# Patient Record
Sex: Male | Born: 1975
Health system: Southern US, Community
[De-identification: ages and names within clinical notes are randomized; demographics above are authoritative.]

## PROBLEM LIST (undated history)

## (undated) DIAGNOSIS — E079 Disorder of thyroid, unspecified: Secondary | ICD-10-CM

## (undated) DIAGNOSIS — I10 Essential (primary) hypertension: Secondary | ICD-10-CM

## (undated) DIAGNOSIS — F419 Anxiety disorder, unspecified: Secondary | ICD-10-CM

## (undated) DIAGNOSIS — L42 Pityriasis rosea: Secondary | ICD-10-CM

## (undated) DIAGNOSIS — E785 Hyperlipidemia, unspecified: Secondary | ICD-10-CM

## (undated) DIAGNOSIS — K219 Gastro-esophageal reflux disease without esophagitis: Secondary | ICD-10-CM

## (undated) HISTORY — DX: Pityriasis rosea: L42

## (undated) HISTORY — DX: Hyperlipidemia, unspecified: E78.5

## (undated) HISTORY — PX: UPPER GASTROINTESTINAL ENDOSCOPY: SHX188

## (undated) HISTORY — DX: Essential (primary) hypertension: I10

## (undated) HISTORY — DX: Anxiety disorder, unspecified: F41.9

## (undated) HISTORY — DX: Disorder of thyroid, unspecified: E07.9

---

## 1994-11-14 HISTORY — PX: UMBILICAL HERNIA REPAIR: SHX196

## 1996-11-14 HISTORY — PX: SHOULDER SURGERY: SHX246

## 2008-10-28 ENCOUNTER — Ambulatory Visit: Payer: Self-pay | Admitting: Family Medicine

## 2008-10-29 DIAGNOSIS — E781 Pure hyperglyceridemia: Secondary | ICD-10-CM | POA: Insufficient documentation

## 2008-10-29 LAB — CONVERTED CEMR LAB
ALT: 14 units/L (ref 0–53)
Alkaline Phosphatase: 62 units/L (ref 39–117)
CO2: 27 meq/L (ref 19–32)
LDL Cholesterol: 102 mg/dL — ABNORMAL HIGH (ref 0–99)
Sodium: 141 meq/L (ref 135–145)
Total Bilirubin: 0.7 mg/dL (ref 0.3–1.2)
Total Protein: 7.3 g/dL (ref 6.0–8.3)
VLDL: 45 mg/dL — ABNORMAL HIGH (ref 0–40)

## 2008-12-18 ENCOUNTER — Telehealth: Payer: Self-pay | Admitting: Family Medicine

## 2009-05-15 ENCOUNTER — Ambulatory Visit: Payer: Self-pay | Admitting: Family Medicine

## 2009-09-21 ENCOUNTER — Ambulatory Visit: Payer: Self-pay | Admitting: Occupational Medicine

## 2010-06-11 ENCOUNTER — Ambulatory Visit: Payer: Self-pay | Admitting: Family Medicine

## 2010-06-28 ENCOUNTER — Encounter: Payer: Self-pay | Admitting: Family Medicine

## 2010-06-29 ENCOUNTER — Encounter: Payer: Self-pay | Admitting: Family Medicine

## 2010-06-29 LAB — CONVERTED CEMR LAB
Albumin: 4.9 g/dL (ref 3.5–5.2)
CO2: 24 meq/L (ref 19–32)
Cholesterol: 234 mg/dL — ABNORMAL HIGH (ref 0–200)
Glucose, Bld: 86 mg/dL (ref 70–99)
LDL Cholesterol: 127 mg/dL — ABNORMAL HIGH (ref 0–99)
Potassium: 4.4 meq/L (ref 3.5–5.3)
RBC: 5.23 M/uL (ref 4.22–5.81)
Sodium: 138 meq/L (ref 135–145)
Total Protein: 7.2 g/dL (ref 6.0–8.3)
Triglycerides: 305 mg/dL — ABNORMAL HIGH (ref <150)
WBC: 9 10*3/uL (ref 4.0–10.5)

## 2010-07-02 ENCOUNTER — Ambulatory Visit: Payer: Self-pay | Admitting: Family Medicine

## 2010-07-02 DIAGNOSIS — K219 Gastro-esophageal reflux disease without esophagitis: Secondary | ICD-10-CM | POA: Insufficient documentation

## 2010-12-14 NOTE — Assessment & Plan Note (Signed)
Summary: 2 week f/u BP, TG, GERD   Vital Signs:  Patient profile:   35 year old male Height:      71 inches Weight:      178 pounds Pulse rate:   81 / minute BP sitting:   136 / 84  (left arm) Cuff size:   regular  Vitals Entered By: Avon Gully CMA, Tadashi Burkel Dull) (July 02, 2010 4:07 PM)  Serial Vital Signs/Assessments:  Time      Position  BP       Pulse  Resp  Temp     By 4:09 PM             136/84   78                    Andrea McCrimmon CMA, (AAMA)  CC: f/u Bp   Primary Care Provider:  Nani Gasser MD  CC:  f/u Bp.  History of Present Illness: 127/80, 112/73  126/79 are home BPs. These are normal. Brought in home BP machine to compare to ours   Dexilant samples I gave him made a big improvement in is reflux sxs.  Picked up a new rx. No S.E.  Chest pain has resolved from the last visit.   Current Medications (verified): 1)  Fish Oil 1000 Mg Caps (Omega-3 Fatty Acids) .... Take One Capsule By Mouth Twice A Day 2)  Dexilant 60 Mg Cpdr (Dexlansoprazole) .... Take One Tablet By Mouth 2o Minuites Before Breakfast  Allergies (verified): 1)  Lisinopril (Lisinopril)  Physical Exam  General:  Well-developed,well-nourished,in no acute distress; alert,appropriate and cooperative throughout examination Lungs:  Normal respiratory effort, chest expands symmetrically. Lungs are clear to auscultation, no crackles or wheezes. Heart:  Normal rate and regular rhythm. S1 and S2 normal without gallop, murmur, click, rub or other extra sounds.   Impression & Recommendations:  Problem # 1:  HYPERTENSION, BENIGN ESSENTIAL (ICD-401.1) Home BP cuff is accurate to our machine adn home BPs look great.  Will remove dx of HTN and will monitor yearly.    Problem # 2:  HYPERTRIGLYCERIDEMIA (ICD-272.1) Started taking fish oildaily. Due to recheck in 8 weeks.   Problem # 3:  GERD (ICD-530.81) Doing well on dexlant. Recommend take for 2-3 months and then try to change back to the  prilosec.  His updated medication list for this problem includes:    Dexilant 60 Mg Cpdr (Dexlansoprazole) .Marland Kitchen... Take one tablet by mouth 2o minuites before breakfast  Complete Medication List: 1)  Fish Oil 1000 Mg Caps (Omega-3 fatty acids) .... Take two capsule by mouth twice a day 2)  Dexilant 60 Mg Cpdr (Dexlansoprazole) .... Take one tablet by mouth 2o minuites before breakfast  Patient Instructions: 1)  Recheck Triglycerides in 6-8 weeks, fasting. Can call for the lab slip.  2)

## 2010-12-14 NOTE — Miscellaneous (Signed)
  Clinical Lists Changes  Medications: Added new medication of DEXILANT 60 MG CPDR (DEXLANSOPRAZOLE) take one tablet by mouth 2o minuites before breakfast - Signed Removed medication of PRILOSEC OTC 20 MG TBEC (OMEPRAZOLE MAGNESIUM) One tablet once daily Rx of DEXILANT 60 MG CPDR (DEXLANSOPRAZOLE) take one tablet by mouth 2o minuites before breakfast;  #30 x 2;  Signed;  Entered by: Avon Gully CMA, (AAMA);  Authorized by: Nani Gasser MD;  Method used: Electronically to [facility] [address] [phone]*, [address] [place], [place], [place]  [address], Ph: [phone] or [phone], Fax: [phone]    Prescriptions: DEXILANT 60 MG CPDR (DEXLANSOPRAZOLE) take one tablet by mouth 2o minuites before breakfast  #30 x 2   Entered by:   Avon Gully CMA, (AAMA)   Authorized by:   Nani Gasser MD   Signed by:   Avon Gully CMA, (AAMA) on 06/29/2010   Method used:   [facility] to        [phone] (retail)       [address] [place], [place]  [address]       Ph: [phone] or [phone]       Fax: [phone]   RxID:   [id number]

## 2010-12-14 NOTE — Assessment & Plan Note (Signed)
Summary: Atypcial CP, HTN   Vital Signs:  Patient profile:   35 year old male Height:      71 inches Weight:      159 pounds BMI:     22.26 Pulse rate:   96 / minute BP sitting:   159 / 94  (left arm) Cuff size:   regular  Vitals Entered By: Avon Gully CMA, Duncan Dull) (June 11, 2010 3:56 PM) CC: Bp check   Primary Care Provider:  Nani Gasser MD  CC:  Bp check.  History of Present Illness: CP woke him up around 1-2 AM in the morning about 7 days ago. Lasted an hour Pain over the mid sternum.  No dizziness, No pain in the arm.  No sweating or nausea.  Hard to go back to sleep. Will occ get  a mild discomfort in same about abnout 2 x a  amonth. Occ burping and brash. Hx of acid reflux in the past.  Says sometimes wiht  movement will notices the pain. No alleviating sxs.  Hasnt had any new meds. He is off the BP medications because of cough. Felt the Benicar caused cough as well so topped it. Likely the ACEi cough had not rsolved yet.  He had checked his BP at home and says usually gets great numbers.    Current Medications (verified): 1)  Prilosec Otc 20 Mg Tbec (Omeprazole Magnesium) .... One Tablet Once Daily 2)  Fish Oil 1000 Mg Caps (Omega-3 Fatty Acids) .... Take One Capsule By Mouth Twice A Day  Allergies (verified): 1)  Lisinopril (Lisinopril)  Comments:  Nurse/Medical Assistant: The patient's medications and allergies were reviewed with the patient and were updated in the Medication and Allergy Lists. Avon Gully CMA, Duncan Dull) (June 11, 2010 3:58 PM)  Past History:  Family History: Last updated: 10/28/2008 Mother and Father with HTN  Physical Exam  General:  Well-developed,well-nourished,in no acute distress; alert,appropriate and cooperative throughout examination Head:  Normocephalic and atraumatic without obvious abnormalities. No apparent alopecia or balding. Neck:  No deformities, masses, or tenderness noted. Chest Wall:  No deformities, masses,  tenderness or gynecomastia noted. Lungs:  Normal respiratory effort, chest expands symmetrically. Lungs are clear to auscultation, no crackles or wheezes. Heart:  Normal rate and regular rhythm. S1 and S2 normal without gallop, murmur, click, rub or other extra sounds. No murmur lying flat.  No carotid bruits.  Abdomen:  Bowel sounds positive,abdomen soft and non-tender without masses, organomegaly or hernias noted. Extremities:  No LE edema.  Neurologic:  alert & oriented X3.   Skin:  no rashes.   Cervical Nodes:  No lymphadenopathy noted Psych:  Cognition and judgment appear intact. Alert and cooperative with normal attention span and concentration. No apparent delusions, illusions, hallucinations   Impression & Recommendations:  Problem # 1:  CHEST PAIN, ATYPICAL (ICD-786.59) Explained less likely to be cardiac. More likely to be GERD related or possible costchondritis since pain can occ be positional.  IN meantime will rule out anemia, thyroid d/o et and check lipids.  EKG shows rate of 68 bpm, NSR, no acute changes.  Consider trial PPI for 2 weeks to see if notices a difference.  + hx of GERD. Used to see GI.   Orders: EKG w/ Interpretation (93000) T-Comprehensive Metabolic Panel (16[address]-60454) T-CBC No Diff (09811-91478) T-TSH (29562-13086) T-Lipid Profile (57846-96295)  Problem # 2:  HYPERTENSION, BENIGN ESSENTIAL (ICD-401.1) BP is elevated today.  Check BP daily at home for one week in the AM and evening  and bring in pressures and machine to compare it to our machine to eval if has true HTN or white coat hypertension.   Complete Medication List: 1)  Prilosec Otc 20 Mg Tbec (Omeprazole magnesium) .... One tablet once daily 2)  Fish Oil 1000 Mg Caps (Omega-3 fatty acids) .... Take one capsule by mouth twice a day  Patient Instructions: 1)  BP is elevated today.  Check BP daily at home for one week in the AM and evening and bring in pressures and machine to compare it to our  machine to eval if has true HTN or white coat hypertension.  2)  We will call you with your lab results.  3)  Please schedule a follow-up appointment in 1 weeks.  4)  Start dexilant one a day in the morning.

## 2011-03-11 ENCOUNTER — Inpatient Hospital Stay (INDEPENDENT_AMBULATORY_CARE_PROVIDER_SITE_OTHER)
Admission: RE | Admit: 2011-03-11 | Discharge: 2011-03-11 | Disposition: A | Discharge status: Home / Self Care | Payer: Commercial Indemnity | Source: Ambulatory Visit | Attending: Family Medicine | Admitting: Family Medicine

## 2011-03-11 ENCOUNTER — Encounter: Payer: Self-pay | Admitting: Family Medicine

## 2011-03-11 DIAGNOSIS — R030 Elevated blood-pressure reading, without diagnosis of hypertension: Secondary | ICD-10-CM | POA: Insufficient documentation

## 2011-03-11 DIAGNOSIS — J0100 Acute maxillary sinusitis, unspecified: Secondary | ICD-10-CM

## 2011-10-17 NOTE — Progress Notes (Signed)
Summary: SINUS PROBLEMS/TJ rm 4   Vital Signs:  Patient Profile:   35 Years Old Male CC:      sinus pressure and congeston x 2-3 wks Height:     71 inches Weight:      173 pounds O2 Sat:      99 % O2 treatment:    Room Air Temp:     98.5 degrees F oral Pulse rate:   80 / minute Resp:     14 per minute BP sitting:   152 / 88  (left arm) Cuff size:   regular  Vitals Entered By: Clemens Catholic LPN (March 11, 2011 4:34 PM)                  Updated Prior Medication List: FISH OIL 1000 MG CAPS (OMEGA-3 FATTY ACIDS) Take two capsule by mouth twice a day DEXILANT 60 MG CPDR (DEXLANSOPRAZOLE) take one tablet by mouth 2o minuites before breakfast  Current Allergies: ! * IVP DYE LISINOPRIL (LISINOPRIL)History of Present Illness Chief Complaint: sinus pressure and congeston x 2-3 wks History of Present Illness:  Subjective:  Patient complains of onset of mild cold symptoms that started about 3 weeks ago but never really developed a cough.  He has developed a mild fronal headache and nasal drainage has been green.  Mild scratchy throat and post-nasal drainage.  No fevers, chills, and sweats  He notes that his blood pressure at home is always within normal limits (he states that he uses a wrist monitor).  REVIEW OF SYSTEMS Constitutional Symptoms      Denies fever, chills, night sweats, weight loss, weight gain, and fatigue.  Eyes       Denies change in vision, eye pain, eye discharge, glasses, contact lenses, and eye surgery. Ear/Nose/Throat/Mouth       Complains of frequent runny nose and sinus problems.      Denies hearing loss/aids, change in hearing, ear pain, ear discharge, dizziness, frequent nose bleeds, sore throat, hoarseness, and tooth pain or bleeding.  Respiratory       Complains of dry cough.      Denies productive cough, wheezing, shortness of breath, asthma, bronchitis, and emphysema/COPD.  Cardiovascular       Denies murmurs, chest pain, and tires easily with  exhertion.    Gastrointestinal       Denies stomach pain, nausea/vomiting, diarrhea, constipation, blood in bowel movements, and indigestion. Genitourniary       Denies painful urination, kidney stones, and loss of urinary control. Neurological       Denies paralysis, seizures, and fainting/blackouts. Musculoskeletal       Denies muscle pain, joint pain, joint stiffness, decreased range of motion, redness, swelling, muscle weakness, and gout.  Skin       Denies bruising, unusual mles/lumps or sores, and hair/skin or nail changes.  Psych       Denies mood changes, temper/anger issues, anxiety/stress, speech problems, depression, and sleep problems. Other Comments: pt c/o sinus pressure and congestion, HA, green nasal drainage x 2-3wks. he has taken OTC allergy med and Dayquil with no relief.   Past History:  Past Medical History: Reviewed history from 10/28/2008 and no changes required. None  Past Surgical History: Reviewed history from 10/28/2008 and no changes required. Left Shoulder Surgery 1997  Family History: Reviewed history from 10/28/2008 and no changes required. Mother and Father with HTN  Social History: Reviewed history from 10/28/2008 and no changes required. 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  BS degree.  Married to [employer] with one child.  Never Smoked Alcohol use-yes 1-2 q wk Drug use-no Regular exercise-no   Objective:  Appearance:  Patient appears healthy, stated age, and in no acute distress  Eyes:  Pupils are equal, round, and reactive to light and accomdation.  Extraocular movement is intact.  Conjunctivae are not inflamed.  Ears:  Canals normal.  Tympanic membranes normal.   Nose:  Congested turbinates.  No sinus tenderness  Pharynx:  Normal  Neck:  Supple.  No adenopathy is present.   Lungs:  Clear to auscultation.  Breath sounds are equal.  Heart:  Regular rate and rhythm without murmurs, rubs, or gallops.  Assessment New Problems: ELEVATED BP  READING WITHOUT DX HYPERTENSION (ICD-796.2) ACUTE MAXILLARY SINUSITIS (ICD-461.0)   Plan New Medications/Changes: NASONEX 50 MCG/ACT SUSP (MOMETASONE FUROATE) Two sprays in each nostril once a day  #17gm x 1, 03/11/2011, Donna Christen MD AMOXICILLIN 875 MG TABS (AMOXICILLIN) One by mouth two times a day  #20 x 0, 03/11/2011, Donna Christen MD  New Orders: Est. Patient Level IV [45409] Planning Comments:   Begin amoxicillin, expectorant, topical decongestant, Nasonex, saline nasal irrigation.  Increase fluid intake Followup with PCP if not improving 7 to 10 days Discussed elevated BP.  Advised to continue to monitor home BP.  Recommend that he calibrate wrist monitor BP with an arm cuff measurement.  Follow-up with PCP for BP.   The patient and/or caregiver has been counseled thoroughly with regard to medications prescribed including dosage, schedule, interactions, rationale for use, and possible side effects and they verbalize understanding.  Diagnoses and expected course of recovery discussed and will return if not improved as expected or if the condition worsens. Patient and/or caregiver verbalized understanding.  Prescriptions: NASONEX 50 MCG/ACT SUSP (MOMETASONE FUROATE) Two sprays in each nostril once a day  #17gm x 1   Entered and Authorized by:   Donna Christen MD   Signed by:   Donna Christen MD on 03/11/2011   Method used:   Print then Give to Patient   RxID:   [id number] AMOXICILLIN 875 MG TABS (AMOXICILLIN) One by mouth two times a day  #20 x 0   Entered and Authorized by:   Donna Christen MD   Signed by:   Donna Christen MD on 03/11/2011   Method used:   Print then Give to Patient   RxID:   [id number]   Patient Instructions: 1)  Take Mucinex  (guaifenesin) twice daily for congestion. 2)  Increase fluid intake, rest. 3)  May use Afrin nasal spray (or generic oxymetazoline) twice daily for about 5 days.  Also recommend using saline nasal spray several times daily  and/or saline nasal irrigation. 4)  Use Nasonex spray about 15 minutes after using Afrin spray 5)  Followup with family doctor if not improving 7 to 10 days.   Orders Added: 1)  Est. Patient Level IV [32440]

## 2011-12-06 DIAGNOSIS — N469 Male infertility, unspecified: Secondary | ICD-10-CM | POA: Insufficient documentation

## 2012-05-25 ENCOUNTER — Emergency Department (INDEPENDENT_AMBULATORY_CARE_PROVIDER_SITE_OTHER)
Admission: EM | Admit: 2012-05-25 | Discharge: 2012-05-25 | Disposition: A | Discharge status: Home / Self Care | Payer: Commercial Indemnity | Source: Home / Self Care | Attending: Emergency Medicine | Admitting: Emergency Medicine

## 2012-05-25 DIAGNOSIS — L247 Irritant contact dermatitis due to plants, except food: Secondary | ICD-10-CM

## 2012-05-25 DIAGNOSIS — L255 Unspecified contact dermatitis due to plants, except food: Secondary | ICD-10-CM

## 2012-05-25 HISTORY — DX: Gastro-esophageal reflux disease without esophagitis: K21.9

## 2012-05-25 MED ORDER — PREDNISONE (PAK) 10 MG PO TABS: ORAL_TABLET | ORAL | Status: AC

## 2012-05-25 MED ORDER — HYDROXYZINE HCL 25 MG PO TABS: 25.0000 mg | ORAL_TABLET | Freq: Once | ORAL | Status: AC

## 2012-05-25 MED ORDER — METHYLPREDNISOLONE ACETATE 80 MG/ML IJ SUSP
80.0000 mg | Freq: Once | INTRAMUSCULAR | Status: AC
  Administered 2012-05-25: 80 mg via INTRAMUSCULAR

## 2012-05-25 NOTE — ED Provider Notes (Signed)
History     CSN: [id number]  Arrival date & time 05/25/12  1117   First MD Initiated Contact with Patient 05/25/12 1121      Chief Complaint  Patient presents with  . Rash    2 days    HPI Exposed to poison ivy about a week ago and had diffuse pruritic rash patches that were pruritic without any drainage. He seemed to be improving somewhat but then 2 days ago, worsening similar rash on trunk and extremities and neck. Has tried OTC topical steroid cream without significant help. The itch is severe and keeps him up at night. Past Medical History  Diagnosis Date  . GERD (gastroesophageal reflux disease)     Past Surgical History  Procedure Date  . Shoulder surgery 1998    Family History  Problem Relation Age of Onset  . Hyperlipidemia Father     History  Substance Use Topics  . Smoking status: Never Smoker   . Smokeless tobacco: Never Used  . Alcohol Use: Not on file      Review of Systems  HENT: Negative for facial swelling and trouble swallowing.   All other systems reviewed and are negative.    Allergies  Lisinopril  Home Medications   Current Outpatient Rx  Name Route Sig Dispense Refill  . OMEPRAZOLE 20 MG PO CPDR Oral Take 20 mg by mouth as needed.    Marland Kitchen HYDROXYZINE HCL 25 MG PO TABS Oral Take 1 tablet (25 mg total) by mouth once. Take 1 at bedtime as needed for itch. May cause drowsiness. 10 tablet 0  . PREDNISONE (PAK) 10 MG PO TABS  Take as directed for 6 days. 21 tablet 0    BP 156/84  Pulse 82  Temp 97.9 F (36.6 C) (Oral)  Resp 17  Ht 5' 11.5" (1.816 m)  Wt 177 lb (80.287 kg)  BMI 24.34 kg/m2  SpO2 96%  Physical Exam  Nursing note and vitals reviewed. Constitutional: He is oriented to person, place, and time. He appears well-developed and well-nourished. No distress.  HENT:  Head: Normocephalic and atraumatic.  Eyes: Conjunctivae and EOM are normal. Pupils are equal, round, and reactive to light. No scleral icterus.  Neck: Normal range  of motion.  Cardiovascular: Normal rate.   Pulmonary/Chest: Effort normal.  Abdominal: He exhibits no distension.  Musculoskeletal: Normal range of motion.  Neurological: He is alert and oriented to person, place, and time.  Skin: Skin is warm. Rash (diffuse patches of blanching erythema, maculopapular , in streaks in clusters, on neck, few small areas on face, trunk and upper and lower extremities.) noted.  Psychiatric: He has a normal mood and affect.    ED Course  Procedures (including critical care time)  Labs Reviewed - No data to display No results found.   1. Contact dermatitis and eczema due to plant       MDM  Severe contact dermatitis, he likely would benefit from Depo-Medrol IM and prednisone 10 mg 6 day Dosepak. After risks, benefits, alternatives discussed, patient agrees with this treatment. I also prescribed by mouth hydroxyzine, one at bedtime when necessary severe itch. Other general measures discussed. Followup here or with PCP when necessary.        Lajean Manes, MD 05/25/12 1201

## 2012-05-25 NOTE — ED Notes (Signed)
Brad complains of a rash around his waist and buttock for 2 days. The rash itches but is not painful. Denies fever, chill, or sweats.

## 2012-09-24 ENCOUNTER — Ambulatory Visit (INDEPENDENT_AMBULATORY_CARE_PROVIDER_SITE_OTHER): Payer: Commercial Indemnity | Admitting: Family Medicine

## 2012-09-24 ENCOUNTER — Encounter: Payer: Self-pay | Admitting: Family Medicine

## 2012-09-24 VITALS — BP 150/94 | HR 89 | Ht 71.5 in | Wt 178.0 lb

## 2012-09-24 DIAGNOSIS — Z0000 Encounter for general adult medical examination without abnormal findings: Secondary | ICD-10-CM

## 2012-09-24 DIAGNOSIS — Z23 Encounter for immunization: Secondary | ICD-10-CM

## 2012-09-24 NOTE — Progress Notes (Signed)
Subjective:    Patient ID: Robert Morris, male    DOB: [birth year], 36 y.o.   MRN: [id number]  HPI He is here for complete physical exam today. He has no specific polyps or complaints. He did have a labral repair on his left shoulder and says still occasionally has problems with the shoulder. He's been considering going back to see an orthopedist about it. He says the muscles like the shoulder subluxes.   Review of Systems Comprehensive review of systems is negative. Chest pain or shortness of breath.  BP 150/94  Pulse 89  Ht 5' 11.5" (1.816 m)  Wt 178 lb (80.74 kg)  BMI 24.48 kg/m2    Allergies  Allergen Reactions  . Lisinopril     REACTION: cough    Past Medical History  Diagnosis Date  . GERD (gastroesophageal reflux disease)     Past Surgical History  Procedure Date  . Shoulder surgery 1998    Left, repair of labrum  . Umbilical hernia repair 1996    History   Social History  . Marital Status: Married    Spouse Name: N/A    Number of Children: 1  . Years of Education: N/A   Occupational History  . saleman      [employer]   Social History Main Topics  . Smoking status: Never Smoker   . Smokeless tobacco: Never Used  . Alcohol Use: 1.8 oz/week    3 Cans of beer per week  . Drug Use: No  . Sexually Active: Yes -- Male partner(s)   Other Topics Concern  . Not on file   Social History Narrative   Occ exercise.     Family History  Problem Relation Age of Onset  . Hyperlipidemia Father     Outpatient Encounter Prescriptions as of 09/24/2012  Medication Sig Dispense Refill  . omeprazole (PRILOSEC) 20 MG capsule Take 20 mg by mouth as needed.               Objective:   Physical Exam  Constitutional: He is oriented to person, place, and time. He appears well-developed and well-nourished.  HENT:  Head: Normocephalic and atraumatic.  Right Ear: External ear normal.  Left Ear: External ear normal.  Nose: Nose normal.    Mouth/Throat: Oropharynx is clear and moist.  Eyes: Conjunctivae normal and EOM are normal. Pupils are equal, round, and reactive to light.  Neck: Normal range of motion. Neck supple. No thyromegaly present.  Cardiovascular: Normal rate, regular rhythm, normal heart sounds and intact distal pulses.        No carotid bruits.   Pulmonary/Chest: Effort normal and breath sounds normal.  Abdominal: Soft. Bowel sounds are normal. He exhibits no distension and no mass. There is no tenderness. There is no rebound and no guarding.  Musculoskeletal: Normal range of motion. He exhibits no edema.  Lymphadenopathy:    He has no cervical adenopathy.  Neurological: He is alert and oriented to person, place, and time. He has normal reflexes.  Skin: Skin is warm and dry.  Psychiatric: He has a normal mood and affect. His behavior is normal. Judgment and thought content normal.          Assessment & Plan:  CPE Keep up a regular exercise program and make sure you are eating a healthy diet Try to eat 4 servings of dairy a day, or if you are lactose intolerant take a calcium with vitamin D daily.  Your vaccines are up to  date.  Go for CMp and lpids when able.   Shoulder Pain - Recommend see Dr. Karie Schwalbe if he is interested in further evaluation but not necessarily at point of wanting surgery.    Tdap and flu vaccine given today.   Elevated BP - Home BPs well controlled and brought in home cuff today to compare to ours and it looks good. Home cuff seems to be working well.   Form completed for work.

## 2012-09-24 NOTE — Patient Instructions (Addendum)
Keep up a regular exercise program and make sure you are eating a healthy diet Try to eat 4 servings of dairy a day, or if you are lactose intolerant take a calcium with vitamin D daily.  Your vaccines are up to date.   

## 2012-09-25 LAB — LIPID PANEL
Cholesterol: 211 mg/dL — ABNORMAL HIGH (ref 0–200)
VLDL: 75 mg/dL — ABNORMAL HIGH (ref 0–40)

## 2012-09-26 ENCOUNTER — Telehealth: Payer: Self-pay | Admitting: *Deleted

## 2012-09-26 LAB — COMPLETE METABOLIC PANEL WITH GFR
AST: 16 U/L (ref 0–37)
Albumin: 4.9 g/dL (ref 3.5–5.2)
BUN: 14 mg/dL (ref 6–23)
Calcium: 9.7 mg/dL (ref 8.4–10.5)
Chloride: 105 mEq/L (ref 96–112)
Glucose, Bld: 87 mg/dL (ref 70–99)
Potassium: 4.6 mEq/L (ref 3.5–5.3)
Total Protein: 6.9 g/dL (ref 6.0–8.3)

## 2012-09-27 NOTE — Telephone Encounter (Signed)
See other message

## 2013-10-21 ENCOUNTER — Encounter: Payer: Self-pay | Admitting: Emergency Medicine

## 2013-10-21 ENCOUNTER — Emergency Department (INDEPENDENT_AMBULATORY_CARE_PROVIDER_SITE_OTHER)
Admission: EM | Admit: 2013-10-21 | Discharge: 2013-10-21 | Disposition: A | Discharge status: Home / Self Care | Payer: Commercial Indemnity | Source: Home / Self Care | Attending: Family Medicine | Admitting: Family Medicine

## 2013-10-21 DIAGNOSIS — K219 Gastro-esophageal reflux disease without esophagitis: Secondary | ICD-10-CM

## 2013-10-21 MED ORDER — OMEPRAZOLE 40 MG PO CPDR: DELAYED_RELEASE_CAPSULE | ORAL | Status: DC

## 2013-10-21 MED ORDER — GI COCKTAIL ~~LOC~~: 30.0000 mL | Freq: Once | ORAL | Status: DC

## 2013-10-21 NOTE — ED Notes (Signed)
Epigastric pain worse today, patient states he has had this pain for years, takes nexium for reflux

## 2013-10-21 NOTE — ED Provider Notes (Signed)
CSN: [id number]     Arrival date & time 10/21/13  1834 History   First MD Initiated Contact with Patient 10/21/13 1932     Chief Complaint  Patient presents with  . Abdominal Pain      HPI Comments: Patient has been having intermittent epigastric pain for several weeks.  Today at 4pm while driving he had similar pain radiating to left chest that lasted about 10 minutes.  Later today while lifting a crate of drinks he felt a brief episode of epigastric discomfort.  No nausea/vomiting.  No shortness of breath. He has a long history of GERD.  Approximately 20 years ago he had upper endoscopy that revealed gastritis, treated with omeprazole.  He has taken omeprazole intermittently since then.  Over the past month he has taken OTC Nexium 20mg  once daily. Patient has a history of hypertriglyceridemia.  Family history of hypertension but no ASCVD.  Patient is a 37 y.o. male presenting with chest pain. The history is provided by the patient.  Chest Pain Pain location:  Epigastric and L chest Pain quality: burning   Pain radiates to:  Precordial region Pain radiates to the back: no   Pain severity:  Mild Onset quality:  Sudden Duration:  10 minutes Timing:  Intermittent Progression:  Improving Chronicity:  Recurrent Context: lifting and at rest   Relieved by:  None tried Worsened by:  Certain positions Ineffective treatments: OTC Nexium. Associated symptoms: AICD problem and heartburn   Associated symptoms: no abdominal pain, no anorexia, no anxiety, no back pain, no cough, no diaphoresis, no dizziness, no dysphagia, no fatigue, no fever, no lower extremity edema, no nausea, no near-syncope, no palpitations, no shortness of breath and not vomiting   Risk factors: hypertension and male sex     Past Medical History  Diagnosis Date  . GERD (gastroesophageal reflux disease)    Past Surgical History  Procedure Laterality Date  . Shoulder surgery  1998    Left, repair of labrum  . Umbilical  hernia repair  1996   Family History  Problem Relation Age of Onset  . Hyperlipidemia Father    History  Substance Use Topics  . Smoking status: Never Smoker   . Smokeless tobacco: Never Used  . Alcohol Use: 1.8 oz/week    3 Cans of beer per week    Review of Systems  Constitutional: Negative for fever, diaphoresis and fatigue.  HENT: Negative for trouble swallowing.   Respiratory: Negative for cough and shortness of breath.   Cardiovascular: Positive for chest pain. Negative for palpitations and near-syncope.  Gastrointestinal: Positive for heartburn. Negative for nausea, vomiting, abdominal pain and anorexia.  Musculoskeletal: Negative for back pain.  Neurological: Negative for dizziness.  All other systems reviewed and are negative.    Allergies  Lisinopril  Home Medications   Current Outpatient Rx  Name  Route  Sig  Dispense  Refill  . omeprazole (PRILOSEC) 40 MG capsule      Take one capsule by mouth twice daily, 30 minutes before a meal.   60 capsule   1    BP 156/96  Pulse 80  Temp(Src) 98.3 F (36.8 C) (Oral)  Ht 5\' 11"  (1.803 m)  Wt 181 lb (82.101 kg)  BMI 25.26 kg/m2  SpO2 98% Physical Exam Nursing notes and Vital Signs reviewed. Appearance:  Patient appears healthy, stated age, and in no acute distress Eyes:  Pupils are equal, round, and reactive to light and accomodation.  Extraocular movement is intact.  Conjunctivae are not inflamed   Pharynx:  Normal Neck:  Supple.   No adenopathy.  Carotids have normal upstrokes Lungs:  Clear to auscultation.  Breath sounds are equal.  Chest:  No tenderness to palpation Heart:  Regular rate and rhythm without murmurs, rubs, or gallops.  Abdomen:  Vague sub-xiphoid tenderness without masses or hepatosplenomegaly.  Bowel sounds are present.  No CVA or flank tenderness.  Extremities:  No edema.  No calf tenderness Skin:  No rash present.   ED Course  Procedures  none        EKG Interpretation     Date/Time: 10/21/13; 1902    Ventricular Rate:  74   PR Interval:  0.0162    QRS Duration:  0.102   QT Interval:  0.346    QTC Calculation:  0.370   R Axis:   41 degrees    Text Interpretation:  Normal; no change from previous              MDM   1. GERD (gastroesophageal reflux disease), recurrent    Chart reviewed; EKG has no significant change from 06/11/10  Patient's epigastric discomfort resolved after GI cocktail. Begin omeprazole 40mg  BID.  Refer to [facility] Recommend follow-up with PCP for consideration of exercise stress test. If symptoms become significantly worse during the night or over the weekend, proceed to the local emergency room.     Lattie Haw, MD 10/21/13 2031

## 2013-10-22 ENCOUNTER — Telehealth: Payer: Self-pay | Admitting: *Deleted

## 2013-10-25 ENCOUNTER — Ambulatory Visit (INDEPENDENT_AMBULATORY_CARE_PROVIDER_SITE_OTHER): Payer: Commercial Indemnity | Admitting: Family Medicine

## 2013-10-25 ENCOUNTER — Encounter: Payer: Self-pay | Admitting: Family Medicine

## 2013-10-25 VITALS — BP 137/78 | HR 87 | Temp 97.2°F | Ht 71.0 in | Wt 178.0 lb

## 2013-10-25 DIAGNOSIS — R1013 Epigastric pain: Secondary | ICD-10-CM

## 2013-10-25 DIAGNOSIS — R0789 Other chest pain: Secondary | ICD-10-CM

## 2013-10-25 DIAGNOSIS — R232 Flushing: Secondary | ICD-10-CM

## 2013-10-25 LAB — COMPLETE METABOLIC PANEL WITH GFR
AST: 17 U/L (ref 0–37)
Alkaline Phosphatase: 49 U/L (ref 39–117)
BUN: 14 mg/dL (ref 6–23)
Calcium: 9.6 mg/dL (ref 8.4–10.5)
Chloride: 100 mEq/L (ref 96–112)
Creat: 1.17 mg/dL (ref 0.50–1.35)
Glucose, Bld: 75 mg/dL (ref 70–99)
Total Bilirubin: 1 mg/dL (ref 0.3–1.2)
Total Protein: 7.4 g/dL (ref 6.0–8.3)

## 2013-10-25 LAB — CBC WITH DIFFERENTIAL/PLATELET
Basophils Absolute: 0.1 10*3/uL (ref 0.0–0.1)
Eosinophils Absolute: 0.2 10*3/uL (ref 0.0–0.7)
Eosinophils Relative: 3 % (ref 0–5)
Hemoglobin: 16.4 g/dL (ref 13.0–17.0)
Lymphs Abs: 1.7 10*3/uL (ref 0.7–4.0)
MCH: 30.4 pg (ref 26.0–34.0)
MCHC: 35.9 g/dL (ref 30.0–36.0)
MCV: 84.6 fL (ref 78.0–100.0)
Monocytes Relative: 8 % (ref 3–12)
Neutrophils Relative %: 65 % (ref 43–77)
RBC: 5.4 MIL/uL (ref 4.22–5.81)
RDW: 14 % (ref 11.5–15.5)

## 2013-10-25 LAB — TSH: TSH: 3.522 u[IU]/mL (ref 0.350–4.500)

## 2013-10-25 LAB — LIPID PANEL
Cholesterol: 219 mg/dL — ABNORMAL HIGH (ref 0–200)
HDL: 45 mg/dL (ref >39)
Triglycerides: 284 mg/dL — ABNORMAL HIGH (ref <150)
VLDL: 57 mg/dL — ABNORMAL HIGH (ref 0–40)

## 2013-10-25 NOTE — Progress Notes (Signed)
Subjective:    Patient ID: Robert Morris, male    DOB: 30-Dec-1975, 37 y.o.   MRN: [id number]  HPI Had been having epigastric pain on and off for weeks. Went to UC about 4 days ago and was having some chest discomfort. They did an EKG and it was normal. No "heartburn".  Says about a week ago expoerience some numbness and tingling in his left arm when in the shower. Only once episode. Says the epigastric pain felt like a cramping and feels worse when bends forwards.  Occ aggrevated by eating but not consistantly.  Pain will  Last about 15 minutes.  No nausea.  Says his face is more flushed.  Did start prilosec BID 2 days ago after getting relief from GI cocktail. + fam hx of hear disease on mom's side of family. Still has a GB.  Has been feeling more flushed lately.  No NSAIDs, ASA , etc.   Has been stressed out.   Review of Systems     BP 137/78  Pulse 87  Temp(Src) 97.2 F (36.2 C)  Ht 5\' 11"  (1.803 m)  Wt 178 lb (80.74 kg)  BMI 24.84 kg/m2    Allergies  Allergen Reactions  . Lisinopril     REACTION: cough    Past Medical History  Diagnosis Date  . GERD (gastroesophageal reflux disease)     Past Surgical History  Procedure Laterality Date  . Shoulder surgery  1998    Left, repair of labrum  . Umbilical hernia repair  1996    History   Social History  . Marital Status: Married    Spouse Name: N/A    Number of Children: 1  . Years of Education: N/A   Occupational History  . saleman      [employer]   Social History Main Topics  . Smoking status: Never Smoker   . Smokeless tobacco: Never Used  . Alcohol Use: 1.8 oz/week    3 Cans of beer per week  . Drug Use: No  . Sexual Activity: Yes    Partners: Female   Other Topics Concern  . Not on file   Social History Narrative   Occ exercise.     Family History  Problem Relation Age of Onset  . Hyperlipidemia Father   . Bipolar disorder Brother     Outpatient Encounter Prescriptions as of  10/25/2013  Medication Sig  . omeprazole (PRILOSEC) 40 MG capsule Take one capsule by mouth twice daily, 30 minutes before a meal.       Objective:   Physical Exam  Constitutional: He is oriented to person, place, and time. He appears well-developed and well-nourished.  HENT:  Head: Normocephalic and atraumatic.  Right Ear: External ear normal.  Left Ear: External ear normal.  Nose: Nose normal.  Mouth/Throat: Oropharynx is clear and moist.  Eyes: Conjunctivae and EOM are normal. Pupils are equal, round, and reactive to light.  Neck: Neck supple. No thyromegaly present.  Cardiovascular: Normal rate and normal heart sounds.   Pulmonary/Chest: Effort normal and breath sounds normal.  Abdominal: Soft. Bowel sounds are normal. He exhibits no distension and no mass. There is tenderness. There is no rebound and no guarding.  Mildl tendernes at teh epigastrum  Lymphadenopathy:    He has no cervical adenopathy.  Neurological: He is alert and oriented to person, place, and time.  Skin: Skin is warm and dry.  Psychiatric: He has a normal mood and affect.  Assessment & Plan:  Epigastric pain - Still maybe GERD, ulcer, etc.  recommend continue Protonix twice a day at least for the next couple weeks to see if he improves. If no improvement at that point in time then consider referral to GI for further evaluation. I would like to do some lab work today just to make sure that his blood count is normal. We'll also check liver enzymes.  Flushing-unclear etiology. Certainly could be stress induced. His blood pressure looks fine today but he has a couple blood pressures at home or his diastolic was around 90. Certainly this could be related. He denies taking any other products such as NSAIDs which can also cause flushing. Will check TSH.    Atypical chest pain-I think cardiac is less likely that there is a family history of heart disease. If his workup is negative and he continues to have  symptoms then consider further evaluation with treadmill stress test. chech TSH.

## 2013-12-09 ENCOUNTER — Other Ambulatory Visit: Payer: Self-pay | Admitting: *Deleted

## 2013-12-09 MED ORDER — OMEPRAZOLE 40 MG PO CPDR: DELAYED_RELEASE_CAPSULE | ORAL | Status: DC

## 2014-03-25 ENCOUNTER — Telehealth: Payer: Self-pay | Admitting: Family Medicine

## 2014-03-25 NOTE — Telephone Encounter (Signed)
Please call patient and have him try to decrease his Prilosec/omeprazole to once a day. Typically we only try to keep patients on the twice a day dosing for very short period of time. If he is still taking it twice a day then he needs to decrease his dose to once a day. If this causes breakthrough symptoms then I would like him to call us back and we can consider further workup for his symptoms such as evaluating his gallbladder, et Robert Morris.

## 2014-03-26 NOTE — Telephone Encounter (Signed)
Robert Morris states he is only taking omeprazole once daily for the last 3 months. Since his endoscopy.

## 2014-03-27 NOTE — Telephone Encounter (Signed)
OK, sounds good. Thank you.  Just wanted to make sure as the insurance company had notified us.

## 2014-04-24 ENCOUNTER — Other Ambulatory Visit: Payer: Self-pay | Admitting: Family Medicine

## 2014-07-13 ENCOUNTER — Other Ambulatory Visit: Payer: Self-pay | Admitting: Family Medicine

## 2014-07-18 ENCOUNTER — Telehealth: Payer: Self-pay | Admitting: Family Medicine

## 2014-07-18 NOTE — Telephone Encounter (Signed)
Please call pt: if he is doing well on his PPI then try to decrease to once a day. We try not to use BID for long term treatment.

## 2014-07-22 NOTE — Telephone Encounter (Signed)
lvm w/recommendations.Robert Morris  

## 2014-10-07 ENCOUNTER — Ambulatory Visit (INDEPENDENT_AMBULATORY_CARE_PROVIDER_SITE_OTHER): Payer: Commercial Indemnity | Admitting: Family Medicine

## 2014-10-07 ENCOUNTER — Encounter: Payer: Self-pay | Admitting: Family Medicine

## 2014-10-07 VITALS — BP 155/85 | HR 88 | Temp 97.9°F | Ht 71.0 in | Wt 179.0 lb

## 2014-10-07 DIAGNOSIS — B09 Unspecified viral infection characterized by skin and mucous membrane lesions: Secondary | ICD-10-CM

## 2014-10-07 DIAGNOSIS — M791 Myalgia: Secondary | ICD-10-CM

## 2014-10-07 DIAGNOSIS — R21 Rash and other nonspecific skin eruption: Secondary | ICD-10-CM

## 2014-10-07 DIAGNOSIS — IMO0001 Reserved for inherently not codable concepts without codable children: Secondary | ICD-10-CM

## 2014-10-07 DIAGNOSIS — M609 Myositis, unspecified: Secondary | ICD-10-CM

## 2014-10-07 NOTE — Progress Notes (Signed)
   Subjective:    Patient ID: Robert Morris, male    DOB: [birth year], 38 y.o.   MRN: [id number]  HPI  He was in [place] last week. While this a he had some body aches and possible fever. He just felt bad that day. Then 2 days later on Saturday he noticed itchy rash. By the following night on Sunday he went to urgent care. He was given prednisone and Zyrtec. They felt like his rash was an allergic reaction. He's not sure what he may or may not have come in contact with.  Review of Systems     Objective:   Physical Exam  Constitutional: He is oriented to person, place, and time. He appears well-developed and well-nourished.  HENT:  Head: Normocephalic and atraumatic.  Right Ear: External ear normal.  Left Ear: External ear normal.  Nose: Nose normal.  Mouth/Throat: Oropharynx is clear and moist.  TMs and canals are clear.   Eyes: Conjunctivae and EOM are normal. Pupils are equal, round, and reactive to light.  Neck: Neck supple. No thyromegaly present.  Cardiovascular: Normal rate and normal heart sounds.   Pulmonary/Chest: Effort normal and breath sounds normal.  Lymphadenopathy:    He has no cervical adenopathy.  Neurological: He is alert and oriented to person, place, and time.  Skin: Skin is warm and dry.  Patchy erythematous rash on the upper chest. Appears to be a fine sandpaper type rash.  Psychiatric: He has a normal mood and affect.          Assessment & Plan:  Rash/fever.  I really like this is more viral.  Based on his history and the pattern of occurrence I really do not think that this was an allergic reaction. I feel he has some type of viral exanthem they cause some swelling in the throat as well as the myalgias and fever. Right now he is feeling mostly better. If he suddenly gets worse, the rash spreads, or he develops another fever then please call the office back immediately. Certainly if this occurs again then we'll consider referral for allergy  testing.

## 2014-10-17 ENCOUNTER — Ambulatory Visit (INDEPENDENT_AMBULATORY_CARE_PROVIDER_SITE_OTHER): Payer: Commercial Indemnity | Admitting: Physician Assistant

## 2014-10-17 ENCOUNTER — Encounter: Payer: Self-pay | Admitting: Physician Assistant

## 2014-10-17 VITALS — BP 138/79 | HR 80 | Ht 71.0 in | Wt 182.0 lb

## 2014-10-17 DIAGNOSIS — R21 Rash and other nonspecific skin eruption: Secondary | ICD-10-CM

## 2014-10-17 MED ORDER — TRIAMCINOLONE ACETONIDE 0.1 % EX CREA: 1.0000 "application " | TOPICAL_CREAM | Freq: Two times a day (BID) | CUTANEOUS | Status: DC

## 2014-10-18 LAB — CBC WITH DIFFERENTIAL/PLATELET
BASOS ABS: 0.2 10*3/uL — AB (ref 0.0–0.1)
BASOS PCT: 1 % (ref 0–1)
EOS ABS: 0.3 10*3/uL (ref 0.0–0.7)
Eosinophils Relative: 2 % (ref 0–5)
HEMATOCRIT: 45.3 % (ref 39.0–52.0)
Hemoglobin: 15.7 g/dL (ref 13.0–17.0)
Lymphocytes Relative: 24 % (ref 12–46)
Lymphs Abs: 3.6 10*3/uL (ref 0.7–4.0)
MCH: 29.5 pg (ref 26.0–34.0)
MCHC: 34.7 g/dL (ref 30.0–36.0)
MCV: 85 fL (ref 78.0–100.0)
MONOS PCT: 6 % (ref 3–12)
MPV: 9 fL — ABNORMAL LOW (ref 9.4–12.4)
Monocytes Absolute: 0.9 10*3/uL (ref 0.1–1.0)
NEUTROS PCT: 67 % (ref 43–77)
Neutro Abs: 10.1 10*3/uL — ABNORMAL HIGH (ref 1.7–7.7)
PLATELETS: 229 10*3/uL (ref 150–400)
RBC: 5.33 MIL/uL (ref 4.22–5.81)
RDW: 14.1 % (ref 11.5–15.5)
WBC: 15.1 10*3/uL — AB (ref 4.0–10.5)

## 2014-10-19 NOTE — Progress Notes (Signed)
   Subjective:    Patient ID: Robert Morris, male    DOB: Apr 29, 1976, 38 y.o.   MRN: [id number]  HPI Pt returns to clinic with same rash. [place] a [place] ago. Felt like coming down with something and developed rash. Went to UC and dx with contact/allergic dermatitis. Given prednisone and zyrtec. Followed up with [facility] 3 days later. She suspected viral exanthem. The rash is itchy and persistant. Not spread. Still contained to upper chest into neck. Taken last prednisone today. No fever, chills, body aches. Feels better and back to nromal except for rash.    Review of Systems  All other systems reviewed and are negative.      Objective:   Physical Exam  Constitutional: He appears well-developed and well-nourished.  Neck: Normal range of motion. Neck supple.  Cardiovascular: Normal rate, regular rhythm and normal heart sounds.   Pulmonary/Chest: Effort normal and breath sounds normal.  Skin:  Patchy erythematous rash of upper chest. No lesions.           Assessment & Plan:  Rash- due to prednisone not helping does not seem to be contact or allergic dermaitis. Still suspect viral etiology;however, pt is feeling much better. Will get cbc/cmp/cmv/ebv to look for viral causes. Triamcinolone given for itchiness. Will follow up accordingly. Call with any worsenen symptoms.

## 2014-10-20 LAB — EPSTEIN-BARR VIRUS VCA ANTIBODY PANEL
EBV EA IgG: 26.5 U/mL — ABNORMAL HIGH (ref <9.0)
EBV NA IgG: >600 U/mL — ABNORMAL HIGH (ref <18.0)
EBV VCA IGG: 134 U/mL — AB (ref <18.0)

## 2014-10-20 LAB — CYTOMEGALOVIRUS ANTIBODY, IGG

## 2014-10-27 ENCOUNTER — Other Ambulatory Visit: Payer: Self-pay | Admitting: *Deleted

## 2014-10-27 DIAGNOSIS — D72829 Elevated white blood cell count, unspecified: Secondary | ICD-10-CM

## 2014-10-30 ENCOUNTER — Telehealth: Payer: Self-pay | Admitting: *Deleted

## 2014-10-30 DIAGNOSIS — D72829 Elevated white blood cell count, unspecified: Secondary | ICD-10-CM

## 2014-10-30 NOTE — Telephone Encounter (Signed)
CBC ordered to recheck WBC.

## 2014-10-31 LAB — CBC WITH DIFFERENTIAL/PLATELET
Basophils Absolute: 0.1 10*3/uL (ref 0.0–0.1)
Basophils Relative: 1 % (ref 0–1)
EOS ABS: 0.4 10*3/uL (ref 0.0–0.7)
Eosinophils Relative: 4 % (ref 0–5)
HEMATOCRIT: 43.5 % (ref 39.0–52.0)
HEMOGLOBIN: 15 g/dL (ref 13.0–17.0)
LYMPHS ABS: 2.2 10*3/uL (ref 0.7–4.0)
Lymphocytes Relative: 25 % (ref 12–46)
MCH: 29.2 pg (ref 26.0–34.0)
MCHC: 34.5 g/dL (ref 30.0–36.0)
MCV: 84.8 fL (ref 78.0–100.0)
MONO ABS: 0.6 10*3/uL (ref 0.1–1.0)
MONOS PCT: 7 % (ref 3–12)
MPV: 9.4 fL (ref 9.4–12.4)
NEUTROS PCT: 63 % (ref 43–77)
Neutro Abs: 5.5 10*3/uL (ref 1.7–7.7)
Platelets: 210 10*3/uL (ref 150–400)
RBC: 5.13 MIL/uL (ref 4.22–5.81)
RDW: 13.7 % (ref 11.5–15.5)
WBC: 8.8 10*3/uL (ref 4.0–10.5)

## 2015-04-02 ENCOUNTER — Ambulatory Visit: Payer: Commercial Indemnity | Admitting: Family Medicine

## 2015-05-19 ENCOUNTER — Emergency Department (INDEPENDENT_AMBULATORY_CARE_PROVIDER_SITE_OTHER)
Admission: EM | Admit: 2015-05-19 | Discharge: 2015-05-19 | Disposition: A | Discharge status: Home / Self Care | Payer: Commercial Indemnity | Source: Home / Self Care | Attending: Emergency Medicine | Admitting: Emergency Medicine

## 2015-05-19 ENCOUNTER — Encounter: Payer: Self-pay | Admitting: *Deleted

## 2015-05-19 ENCOUNTER — Emergency Department (INDEPENDENT_AMBULATORY_CARE_PROVIDER_SITE_OTHER): Payer: Commercial Indemnity

## 2015-05-19 DIAGNOSIS — R05 Cough: Secondary | ICD-10-CM

## 2015-05-19 DIAGNOSIS — R079 Chest pain, unspecified: Secondary | ICD-10-CM | POA: Diagnosis not present

## 2015-05-19 DIAGNOSIS — R059 Cough, unspecified: Secondary | ICD-10-CM

## 2015-05-19 DIAGNOSIS — R0602 Shortness of breath: Secondary | ICD-10-CM

## 2015-05-19 NOTE — Discharge Instructions (Signed)

## 2015-05-19 NOTE — ED Notes (Signed)
Pt c/o central CP described as dull and heavy and constant x 4-5 days. Awoke with a HA today. C/o "airway constriction and dry cough x 2-3 months. Does have H/o allergies and reflux. EKG done and reported to [facility] [place]. BP 172/106, 156/96

## 2015-05-19 NOTE — ED Provider Notes (Signed)
CSN: [id number]     Arrival date & time 05/19/15  0840 History   First MD Initiated Contact with Patient 05/19/15 [phone]     Chief Complaint  Patient presents with  . Chest Pain   (Consider location/radiation/quality/duration/timing/severity/associated sxs/prior Treatment) Patient is a 39 y.o. male presenting with chest pain. The history is provided by the patient. No language interpreter was used.  Chest Pain Pain location:  L chest Pain quality: aching   Pain radiates to:  Does not radiate Pain radiates to the back: no   Pain severity:  Mild Onset quality:  Gradual Duration:  2 weeks Timing:  Constant Progression:  Unchanged Chronicity:  New Context: breathing   Relieved by:  Nothing Worsened by:  Nothing tried Ineffective treatments:  None tried Associated symptoms: heartburn   Associated symptoms: no abdominal pain   Risk factors: hypertension     Past Medical History  Diagnosis Date  . GERD (gastroesophageal reflux disease)   . Seasonal allergies    Past Surgical History  Procedure Laterality Date  . Shoulder surgery  1998    Left, repair of labrum  . Umbilical hernia repair  1996   Family History  Problem Relation Age of Onset  . Hyperlipidemia Father   . Bipolar disorder Brother    History  Substance Use Topics  . Smoking status: Never Smoker   . Smokeless tobacco: Never Used  . Alcohol Use: 1.8 oz/week    3 Cans of beer per week    Review of Systems  Cardiovascular: Positive for chest pain.  Gastrointestinal: Positive for heartburn. Negative for abdominal pain.  All other systems reviewed and are negative.   Allergies  Lisinopril  Home Medications   Prior to Admission medications   Medication Sig Start Date End Date Taking? Authorizing Provider  cetirizine (ZYRTEC) 10 MG tablet Take 10 mg by mouth. 10/05/14 10/05/15  Historical Provider, MD  omeprazole (PRILOSEC) 40 MG capsule TAKE ONE CAPSULE BY MOUTH TWICE DAILY. TAKE 30 MINUTES BEFORE A MEAL.  07/14/14   Hali Marry, MD   BP 172/106 mmHg  Pulse 75  Resp 16  SpO2 99% Physical Exam  Constitutional: He is oriented to person, place, and time. He appears well-developed and well-nourished.  HENT:  Head: Normocephalic.  Right Ear: External ear normal.  Left Ear: External ear normal.  Mouth/Throat: Oropharynx is clear and moist.  Eyes: Conjunctivae and EOM are normal. Pupils are equal, round, and reactive to light.  Neck: Normal range of motion.  Cardiovascular: Normal rate and normal heart sounds.   Pulmonary/Chest: Effort normal and breath sounds normal.  Abdominal: Soft. He exhibits no distension.  Musculoskeletal: Normal range of motion.  Neurological: He is alert and oriented to person, place, and time.  Skin: Skin is warm.  Psychiatric: He has a normal mood and affect.  Nursing note and vitals reviewed.   ED Course  Procedures (including critical care time) Labs Review Labs Reviewed - No data to display  Imaging Review Dg Chest 2 View  05/19/2015   CLINICAL DATA:  Chest pain and shortness of breath for 1 week  EXAM: CHEST  2 VIEW  COMPARISON:  None.  FINDINGS: Lungs are clear. The heart size and pulmonary vascularity are normal. No adenopathy. No pneumothorax. No bone lesions.  IMPRESSION: No edema or consolidation.   Electronically Signed   By: Lowella Grip III M.D.   On: 05/19/2015 09:25     MDM I discussed with Dr. Madilyn Fireman who will set pt  up for a stress test.   Pt advised mylanta 4 times a day..  Take an aspirin daily.  Go to ED if symptoms worsen or change.  I doubt acute cardiac etiology.   1. Cough    Asa Follow up for stress test    Fransico Meadow, PA-C 05/19/15 Leon, PA-C 05/19/15 [phone]

## 2015-05-21 ENCOUNTER — Telehealth: Payer: Self-pay | Admitting: *Deleted

## 2015-05-21 DIAGNOSIS — R0789 Other chest pain: Secondary | ICD-10-CM

## 2015-05-21 NOTE — Telephone Encounter (Signed)
Order for ETT placed for [facility] on [address].Robert Morris [place]

## 2015-05-22 ENCOUNTER — Telehealth: Payer: Self-pay | Admitting: *Deleted

## 2015-05-22 NOTE — Telephone Encounter (Signed)
lvm informing pt of appt and to rtn call to confirm that he did receive my message.Robert Morris [place]

## 2015-05-22 NOTE — Telephone Encounter (Signed)
Robert Morris called to ask if it would be ok if she lvm informing pt of appt. She has scheduled him for 8.12.2016 @ 1015. I told her that would be ok and that I would reach out to him also and if I did not get any response that I would call her and make her aware. [name], [place]

## 2015-06-21 ENCOUNTER — Other Ambulatory Visit: Payer: Self-pay | Admitting: Family Medicine

## 2015-06-26 ENCOUNTER — Encounter: Payer: Commercial Indemnity | Admitting: Physician Assistant

## 2015-06-26 ENCOUNTER — Ambulatory Visit (INDEPENDENT_AMBULATORY_CARE_PROVIDER_SITE_OTHER): Payer: Commercial Indemnity

## 2015-06-26 DIAGNOSIS — R0789 Other chest pain: Secondary | ICD-10-CM

## 2015-06-26 LAB — EXERCISE TOLERANCE TEST
CHL CUP MPHR: 180 {beats}/min
CHL CUP RESTING HR STRESS: 75 {beats}/min
CSEPED: 12 min
CSEPEW: 13.4 METS
Peak HR: 176 {beats}/min
Percent HR: 97 %
RPE: 15

## 2015-08-13 ENCOUNTER — Telehealth: Payer: Self-pay | Admitting: *Deleted

## 2015-08-13 NOTE — Telephone Encounter (Signed)
Pt called and lvm asking if dr. Madilyn Fireman would switch his acid reflux med to nexium. He currently is on omeprazole and feels that this is no longer effective. Will fwd to pcp for advice.Audelia Hives [place]

## 2015-08-14 MED ORDER — ESOMEPRAZOLE MAGNESIUM 20 MG PO CPDR: 20.0000 mg | DELAYED_RELEASE_CAPSULE | Freq: Every day | ORAL | Status: DC

## 2015-08-14 NOTE — Telephone Encounter (Signed)
New perception sent to pharmacy. But he also really needs to be watching his diet and avoiding greasy, spicy, acidic foods. In avoiding carbonated beverages and things with caffeine like coffee etc.

## 2015-08-14 NOTE — Telephone Encounter (Signed)
Patient advised.

## 2015-08-15 ENCOUNTER — Other Ambulatory Visit: Payer: Self-pay | Admitting: Family Medicine

## 2015-10-16 ENCOUNTER — Other Ambulatory Visit: Payer: Self-pay | Admitting: Family Medicine

## 2015-10-16 ENCOUNTER — Ambulatory Visit (INDEPENDENT_AMBULATORY_CARE_PROVIDER_SITE_OTHER): Payer: BLUE CROSS/BLUE SHIELD | Admitting: Family Medicine

## 2015-10-16 ENCOUNTER — Encounter: Payer: Self-pay | Admitting: Family Medicine

## 2015-10-16 VITALS — BP 136/84 | HR 87 | Temp 98.4°F | Resp 18 | Wt 188.6 lb

## 2015-10-16 DIAGNOSIS — R5383 Other fatigue: Secondary | ICD-10-CM

## 2015-10-16 DIAGNOSIS — Z91018 Allergy to other foods: Secondary | ICD-10-CM | POA: Diagnosis not present

## 2015-10-16 DIAGNOSIS — R05 Cough: Secondary | ICD-10-CM

## 2015-10-16 DIAGNOSIS — R053 Chronic cough: Secondary | ICD-10-CM

## 2015-10-16 DIAGNOSIS — Z91011 Allergy to milk products: Secondary | ICD-10-CM

## 2015-10-16 DIAGNOSIS — H0013 Chalazion right eye, unspecified eyelid: Secondary | ICD-10-CM | POA: Diagnosis not present

## 2015-10-16 LAB — COMPLETE METABOLIC PANEL WITH GFR
ALT: 25 U/L (ref 9–46)
AST: 21 U/L (ref 10–40)
Albumin: 5 g/dL (ref 3.6–5.1)
Alkaline Phosphatase: 59 U/L (ref 40–115)
BUN: 17 mg/dL (ref 7–25)
CHLORIDE: 104 mmol/L (ref 98–110)
CO2: 27 mmol/L (ref 20–31)
Calcium: 9.5 mg/dL (ref 8.6–10.3)
Creat: 1.19 mg/dL (ref 0.60–1.35)
GFR, Est African American: 87 mL/min (ref >=60)
GFR, Est Non African American: 75 mL/min (ref >=60)
GLUCOSE: 84 mg/dL (ref 65–99)
POTASSIUM: 4.3 mmol/L (ref 3.5–5.3)
SODIUM: 138 mmol/L (ref 135–146)
Total Bilirubin: 0.8 mg/dL (ref 0.2–1.2)
Total Protein: 7.2 g/dL (ref 6.1–8.1)

## 2015-10-16 LAB — CBC WITH DIFFERENTIAL/PLATELET
BASOS PCT: 1 % (ref 0–1)
Basophils Absolute: 0.1 10*3/uL (ref 0.0–0.1)
EOS PCT: 3 % (ref 0–5)
Eosinophils Absolute: 0.2 10*3/uL (ref 0.0–0.7)
HCT: 46.1 % (ref 39.0–52.0)
HEMOGLOBIN: 16.2 g/dL (ref 13.0–17.0)
Lymphocytes Relative: 21 % (ref 12–46)
Lymphs Abs: 1.7 10*3/uL (ref 0.7–4.0)
MCH: 29.8 pg (ref 26.0–34.0)
MCHC: 35.1 g/dL (ref 30.0–36.0)
MCV: 84.7 fL (ref 78.0–100.0)
MONO ABS: 0.6 10*3/uL (ref 0.1–1.0)
MPV: 9.9 fL (ref 8.6–12.4)
Monocytes Relative: 7 % (ref 3–12)
NEUTROS ABS: 5.4 10*3/uL (ref 1.7–7.7)
Neutrophils Relative %: 68 % (ref 43–77)
Platelets: 216 10*3/uL (ref 150–400)
RBC: 5.44 MIL/uL (ref 4.22–5.81)
RDW: 14 % (ref 11.5–15.5)
WBC: 7.9 10*3/uL (ref 4.0–10.5)

## 2015-10-16 LAB — C-REACTIVE PROTEIN: CRP: <0.5 mg/dL (ref <0.60)

## 2015-10-16 LAB — TSH: TSH: 7.592 u[IU]/mL — ABNORMAL HIGH (ref 0.350–4.500)

## 2015-10-16 MED ORDER — ALBUTEROL SULFATE HFA 108 (90 BASE) MCG/ACT IN AERS: 2.0000 | INHALATION_SPRAY | Freq: Four times a day (QID) | RESPIRATORY_TRACT | Status: DC | PRN

## 2015-10-16 MED ORDER — AZELASTINE HCL 0.1 % NA SOLN: 1.0000 | Freq: Two times a day (BID) | NASAL | Status: DC

## 2015-10-16 MED ORDER — BACITRACIN-POLYMYXIN B 500-10000 UNIT/GM OP OINT: 1.0000 "application " | TOPICAL_OINTMENT | Freq: Three times a day (TID) | OPHTHALMIC | Status: DC

## 2015-10-16 NOTE — Patient Instructions (Signed)
Don't use any inhalers for at least 2 days before you breathing test.

## 2015-10-16 NOTE — Progress Notes (Signed)
   Subjective:    Patient ID: Robert Morris, male    DOB: [birth year], 39 y.o.   MRN: [id number]  HPI Has had a cough since July. Says he doesn't feel "sick" like a cold.  Says feels like has a hard time breathing in sometimes. Feelsl ike his airway is swollen sometimes. He has a hx of GERD.  Taking Prilosec daily.  Had a negative stress test on 06/26/15.  Occ has epigastric pain - sometimes with eating and sometimes not. Rarely has brash. Cough is worse with exertion. Rides bike for exercising.  Sometime when eating feels like food "goes down the wrong way". Getting some phlegm in the AM but more dry thought the daytime.  Cough settle down at night. No hx of asthma.  Mother with hx of asthma.  Feels like easily gets out of breath.  Had normal CXR in [place].  He feels moe fatigued. He also reports a lot of thick phlegm in his throat which is constant. He says it's mostly clear. No discoloration. No fevers or chills.  He also reports a new bump on his right upper eyelid. He said one on the lower lid for several months. He says it feels sore and irritated. He's had these before. He last saw his eye doctor back in March.  Review of Systems     Objective:   Physical Exam  Constitutional: He is oriented to person, place, and time. He appears well-developed and well-nourished.  HENT:  Head: Normocephalic and atraumatic.  Right Ear: External ear normal.  Left Ear: External ear normal.  Nose: Nose normal.  Mouth/Throat: Oropharynx is clear and moist.  TMs and canals are clear. He does have a chalazion on the right upper and right lower eyelid. No drainage.  Eyes: Conjunctivae and EOM are normal. Pupils are equal, round, and reactive to light.  Neck: Neck supple. No thyromegaly present.  Cardiovascular: Normal rate and normal heart sounds.   Pulmonary/Chest: Effort normal and breath sounds normal.  Lymphadenopathy:    He has no cervical adenopathy.  Neurological: He is alert and oriented to person,  place, and time.  Skin: Skin is warm and dry.  Psychiatric: He has a normal mood and affect.          Assessment & Plan:  Chronic cough-most likely to be reflux since he is on a PPI and takes it regularly and has actual reflux symptoms have improved. Unlikely to be cardiac had a negative stress test. Consider postnasal drip and a trial of a nasal and histamine. Also consider late onset asthma. Consider trial of albuterol before exercise since it does seem to aggravate her symptoms. Will check for pertussis.  Consider trial of oral and histamine. We'll also do serum allergy testing.  Fatigue-we'll check for anemia, thyroid disorder, electrolyte disturbance etc.  Chalazia - given a prescription for topical antibiotic and encouraged him to get back in with his eye doctor. He says he does not need a referral for this.

## 2015-10-17 LAB — SEDIMENTATION RATE: Sed Rate: 1 mm/hr (ref 0–15)

## 2015-10-19 ENCOUNTER — Other Ambulatory Visit: Payer: Self-pay | Admitting: Family Medicine

## 2015-10-19 LAB — RESPIRATORY ALLERGY PROFILE REGION II ~~LOC~~
ALLERGEN, CEDAR TREE, T6: 0.75 kU/L — AB
Allergen, Comm Silver Birch, t9: <0.1 kU/L
Allergen, Cottonwood, t14: <0.1 kU/L
Allergen, D pternoyssinus,d7: <0.1 kU/L
Allergen, Mulberry, t76: <0.1 kU/L
Aspergillus fumigatus, m3: <0.1 kU/L
Bermuda Grass: 2.08 kU/L — ABNORMAL HIGH
Cladosporium Herbarum: <0.1 kU/L
Cockroach: <0.1 kU/L
Common Ragweed: <0.1 kU/L
Dog Dander: <0.1 kU/L
Elm IgE: <0.1 kU/L
IgE (Immunoglobulin E), Serum: 16 kU/L (ref <115)
JOHNSON GRASS: 1.2 kU/L — AB
OAK CLASS: 0.17 kU/L — AB
Pecan/Hickory Tree IgE: 0.22 kU/L — ABNORMAL HIGH
Penicillium Notatum: <0.1 kU/L
Rough Pigweed  IgE: <0.1 kU/L
Timothy Grass: 2.58 kU/L — ABNORMAL HIGH

## 2015-10-19 LAB — T3, FREE: T3, Free: 3.1 pg/mL (ref 2.3–4.2)

## 2015-10-19 LAB — T4, FREE: Free T4: 1.1 ng/dL (ref 0.80–1.80)

## 2015-10-21 LAB — IGG FOOD PANEL
ALLERGEN EGG YOLK IGG: 4.1 ug/mL — AB (ref <2.0)
ALLERGEN WHEAT IGG: 0.53 ug/mL — AB (ref <0.15)
ALLERGEN, MILK, IGG: 16.6 ug/mL — AB (ref <0.15)
Beef, IgG: 7.5 ug/mL — ABNORMAL HIGH (ref <2.0)
Chicken, IgG: <0.15 ug/mL (ref <0.15)
Corn, IgG: <0.15 ug/mL (ref <0.15)
Egg white, IgG: 4.4 ug/mL — ABNORMAL HIGH (ref <2.0)
Peanut, IgG: <0.15 ug/mL (ref <0.15)

## 2015-10-21 LAB — BORDETELLA PERTUSSIS PCR
B PERTUSSIS, DNA: NOT DETECTED
B parapertussis, DNA: NOT DETECTED

## 2015-10-21 NOTE — Addendum Note (Signed)
Addended by: Beatrice Lecher D on: 10/21/2015 02:34 PM   Modules accepted: Orders

## 2015-10-23 ENCOUNTER — Telehealth: Payer: Self-pay

## 2015-10-23 NOTE — Telephone Encounter (Signed)
Left message informing patient of lab results and recommendations. 

## 2015-10-23 NOTE — Telephone Encounter (Signed)
-----   Message from Hali Marry, MD sent at 10/20/2015  7:57 AM EST ----- Call patient: Additional thyroid tests looked normal but because his TSH was mildly elevated I would like to recheck it in about 4 weeks with a TSH, free T4 and free T3. Sometimes the levels can just become abnormal for a short period of time especially during an illness. But I want to make sure that he's not trending towards becoming hypothyroid.

## 2015-10-27 LAB — BORDETELLA PERTUSSIS AB IGG,IGA
FHA IGA: 4 [IU]/mL
FHA IgG: 47 IU/mL
PT IGG: 27 [IU]/mL
PT IgA: 2 IU/mL

## 2015-11-02 LAB — CULTURE, BORDETELLA PERTUSSIS

## 2015-11-13 ENCOUNTER — Encounter: Payer: Self-pay | Admitting: Family Medicine

## 2015-11-13 ENCOUNTER — Ambulatory Visit (INDEPENDENT_AMBULATORY_CARE_PROVIDER_SITE_OTHER): Payer: BLUE CROSS/BLUE SHIELD | Admitting: Family Medicine

## 2015-11-13 VITALS — BP 159/101 | HR 74 | Ht 71.0 in | Wt 191.0 lb

## 2015-11-13 DIAGNOSIS — R221 Localized swelling, mass and lump, neck: Secondary | ICD-10-CM | POA: Diagnosis not present

## 2015-11-13 DIAGNOSIS — R0602 Shortness of breath: Secondary | ICD-10-CM

## 2015-11-13 DIAGNOSIS — R053 Chronic cough: Secondary | ICD-10-CM

## 2015-11-13 DIAGNOSIS — R05 Cough: Secondary | ICD-10-CM | POA: Diagnosis not present

## 2015-11-13 MED ORDER — METRONIDAZOLE 0.75 % EX GEL: 1.0000 "application " | Freq: Every day | CUTANEOUS | Status: DC

## 2015-11-13 MED ORDER — LOSARTAN POTASSIUM 50 MG PO TABS: 50.0000 mg | ORAL_TABLET | Freq: Every day | ORAL | Status: DC

## 2015-11-13 MED ORDER — ALBUTEROL SULFATE (2.5 MG/3ML) 0.083% IN NEBU
2.5000 mg | INHALATION_SOLUTION | Freq: Once | RESPIRATORY_TRACT | Status: AC
  Administered 2015-11-13: 2.5 mg via RESPIRATORY_TRACT

## 2015-11-13 NOTE — Progress Notes (Signed)
Subjective:    Patient ID: Robert Morris, male    DOB: Aug 17, 1976, 39 y.o.   MRN: [id number]  HPI Mr. Robert Morris is a 39 year old male who is here today for spirometry testing. I saw him about 4 weeks ago for chronic cough, intermittent shortness of breath, and fatigue. He has had a chronic cough since July and says he sometimes feels like his airways are swollen. We also tested him for food allergy. He showed possible sensitivity to beef, eggs, milk and possibly wheat. He was also sensitive to several grasses as well as cedar trees and pecan trees. He had artery tried and failed a PPI to reduce his cough symptoms. It was felt to be unlikely cardiac related. He had a negative stress test on 06/26/2015.   We did do some lab work which showed a borderline thyroid test. He tested negative for pertussis. We did place a referral for allergy consult. No history of pulmonary problems or asthma. Other the last few days he's felt like a lump in his throat particularly on the left side. It's not painful but is somewhat uncomfortable.  He also has concerns about rosacea. He has multiple family members that have rosacea and is very fair skin. He saysconstantly looks red and sometimes more flushed. He occasionally gets little acne bumps on his forehead and occasionally on his cheeks. All the uses a gel that works really well to control his symptoms and he is interested in treatment.  Review of Systems     Objective:   Physical Exam  Constitutional: He is oriented to person, place, and time. He appears well-developed and well-nourished.  HENT:  Head: Normocephalic and atraumatic.  Eyes: Conjunctivae and EOM are normal.  Cardiovascular: Normal rate.   Pulmonary/Chest: Effort normal.  Neurological: He is alert and oriented to person, place, and time.  Skin: Skin is dry. No pallor.  A few scattered papules on the forehead.   Psychiatric: He has a normal mood and affect. His behavior is normal.  Vitals  reviewed.         Assessment & Plan:  SOB/Cough - at this point he does have some food allergies which we have recently discovered. I recommend avoiding these foods strictly for about 3-4 weeks just to see if it improves his symptoms. Unfortunately the allergy referral was going to be in April. We will work on getting him scheduled at the [facility] location to see if they have availability sooner. I printed off information and gave it to him as he says he will call on his arm. Also because of the sensation of feeling like something is stuck in his throat that he is experiencing the last few days I think we should go ahead and refer him to ENT to rule out any kind of upper airway issue as well. Spirometry shows FVC of 87%, FEV1 of 92%, ratio of 86%. This consistent with normal spirometry with no improvement postbronchodilator. This rules out COPD and asthma.   HTN- looking back over his blood pressure since 2009 his pressures have been up and down. He does report a family history of hypertension as well. We eventually try him on lisinopril at one point in time he developed a cough. We'll try perception for losartan. Like to see him back in about 4-6 weeks to make sure that his blood pressure is well controlled and is tolerating the medication well. He will need a BMP at that time.  Rosacea  -discussed diagnosis. I do  think he may have some mild rosacea. Will treat with topical metronidazole gel. He also has some permanent erythema from sun damage and discussed with him the importance of really being very diligent with his fair skin and minimizing sun exposure and using sunscreens. Also discussed that some of his flushing may even be related to elevated blood pressure levels at times.

## 2015-12-11 ENCOUNTER — Encounter: Payer: Self-pay | Admitting: Internal Medicine

## 2015-12-11 ENCOUNTER — Ambulatory Visit (INDEPENDENT_AMBULATORY_CARE_PROVIDER_SITE_OTHER): Payer: BLUE CROSS/BLUE SHIELD | Admitting: Internal Medicine

## 2015-12-11 VITALS — BP 124/76 | HR 68 | Temp 98.4°F | Resp 16 | Ht 71.0 in | Wt 187.6 lb

## 2015-12-11 DIAGNOSIS — J302 Other seasonal allergic rhinitis: Secondary | ICD-10-CM | POA: Diagnosis not present

## 2015-12-11 DIAGNOSIS — T7840XA Allergy, unspecified, initial encounter: Secondary | ICD-10-CM | POA: Diagnosis not present

## 2015-12-11 DIAGNOSIS — R0602 Shortness of breath: Secondary | ICD-10-CM | POA: Diagnosis not present

## 2015-12-11 MED ORDER — EPINEPHRINE 0.3 MG/0.3ML IJ SOAJ: INTRAMUSCULAR | Status: DC

## 2015-12-11 MED ORDER — AZELASTINE-FLUTICASONE 137-50 MCG/ACT NA SUSP: 1.0000 | Freq: Two times a day (BID) | NASAL | Status: DC

## 2015-12-11 NOTE — Patient Instructions (Addendum)
Other seasonal allergic rhinitis  Allergic to grass and tree pollen  Continue cetirizine 10 mg daily  Start dymista 1 spray each nostril twice a day  Allergic reaction  Check tryptase, alpha gal, CU index panel  Consider repeat EGD looking for eosinophilic esophagitis  Continue Nexium  Given epipen and action plan - educated on use  Cetirizine daily as above

## 2015-12-11 NOTE — Assessment & Plan Note (Addendum)
   Check tryptase, alpha gal, CU index panel  Consider repeat EGD looking for eosinophilic esophagitis  Continue Nexium  Given epipen and action plan - educated on use  Cetirizine daily as above

## 2015-12-11 NOTE — Progress Notes (Signed)
Referring provider: Hali Marry, [address] [place], [place] [address]  History of Present Illness:  Robert Morris is a 40 y.o. male seen in consultation at the kind request of Dr. Madilyn Fireman for allergic reactions.  HPI Comments: Allergic reaction: He had an episode of urticaria in November 2016 which prompted a visit to the emergency room. Symptoms occurred after traveling to [place]. No obvious triggers such as foods medicines or products. He was treated with prednisone with resolution of his symptoms. In April 2016, patient developed shortness of breath and throat tightness. He went to the emergency room where a chest x-ray, CT of the head, EKG were all within normal limits. A CBC and CMP were also normal with an eosinophil count of 400 which is the upper end of normal. He reports throat swelling at least once a day that is usually worse after eating. He has an associated dry cough. He often chokes on solid foods such as steak or chicken but has never had food stuck for a long period of time. He had an EGD done 2 years ago that was normal and had a negative biopsy for H. pylori. He's also had a normal HIDA scan. An abdominal ultrasound done showed hepatic steatosis with a polyp in his gallbladder. Lab work obtained recently by his primary care physician demonstrated a normal CBC with differential (eosinophil count at that time was 200), CMP, pertussis screen and culture was negative. Specific IgE to foods were not done. He was found to have an elevated TSH but normal T4. He is being monitored for that.  Shortness of breath/cough: Patient feels short of breath especially when he wakes from sleep. He has tried albuterol without improvement in his symptoms. He occasionally has exercise-induced bronchospasm. He has never had any severe exacerbations.  Allergic rhinitis: Patient gets symptoms that are worse in the spring. Sinus infections occur about once every 2 years. He has  tried Astelin without improvement in his symptoms. Zyrtec makes him groggy sometimes. Specific IgE to environmental allergens was positive for grass and tree pollen.   Assessment and Plan: Other seasonal allergic rhinitis  Allergic to grass and tree pollen  Continue cetirizine 10 mg daily. May also use Claritin or Allegra 1 tablet daily  Start dymista 1 spray each nostril twice a day  Allergic reaction  Check tryptase, alpha gal, CU index panel  Consider repeat EGD looking for eosinophilic esophagitis  Continue Nexium  Given epipen and action plan - educated on use  Cetirizine daily as above  Shortness of breath  Continue as needed albuterol    Return in about 3 months (around 03/10/2016).  Medications ordered this encounter: Meds ordered this encounter  Medications  . esomeprazole (NEXIUM) 20 MG capsule    Sig:     Refill:  2    Diagnostics: Spirometry: FEV1 4L or 93%, FEV1/FVC  83%.  This is a normal study. Food allergy skin testing: Negative with a good histamine control  Skin tests were interpreted by me, transferred into EPIC by CMA, reviewed and accepted by me into EPIC.  Physical Exam: BP 124/76 mmHg  Pulse 68  Temp(Src) 98.4 F (36.9 C) (Oral)  Resp 16  Ht 5\' 11"  (1.803 m)  Wt 187 lb 9.8 oz (85.1 kg)  BMI 26.18 kg/m2   Physical Exam  Review of systems: Per HPI unless specifically indicated below Review of Systems  Constitutional: Negative for fever, chills and unexpected weight change.  HENT: Positive  for postnasal drip, rhinorrhea and sneezing. Negative for congestion, ear pain and sinus pressure.   Eyes: Positive for itching. Negative for pain and redness.  Respiratory: Positive for cough, choking and shortness of breath. Negative for chest tightness and wheezing.   Cardiovascular: Negative for chest pain and leg swelling.  Gastrointestinal: Negative for vomiting and diarrhea.  Genitourinary: Negative for difficulty urinating.   Musculoskeletal: Negative for joint swelling and arthralgias.  Skin: Positive for rash.  Allergic/Immunologic: Positive for environmental allergies and food allergies. Negative for immunocompromised state.       Stung by yellow jacket - no problem No latex allergy  Neurological: Negative for seizures.    Past medical history:  Patient Active Problem List   Diagnosis Date Noted  . Other seasonal allergic rhinitis 12/11/2015  . Allergic reaction 12/11/2015  . Shortness of breath 12/11/2015  . ELEVATED BP READING WITHOUT DX HYPERTENSION 03/11/2011  . GERD 07/02/2010  . HYPERTRIGLYCERIDEMIA 10/29/2008    Past surgical history: Past Surgical History  Procedure Laterality Date  . Shoulder surgery  1998    Left, repair of labrum  . Umbilical hernia repair  1996    Family history: Family History  Problem Relation Age of Onset  . Hyperlipidemia Father   . Bipolar disorder Brother   . Asthma Mother     Environmental/Social history: He lives in a house that is 41 years of age, he has a non-feather pillow and comforter, there is carpet in the home, there is central air conditioning and heating, there is a dog in the home, he is a nonsmoker, he drinks 2-4 drinks per week, he is a Librarian, academic for public services.  Drug Allergies:  Allergies  Allergen Reactions  . Iodine     Had a reaction to IV dye with skin rash  . Lisinopril     REACTION: cough    Medications: Current outpatient prescriptions:  .  albuterol (PROVENTIL HFA;VENTOLIN HFA) 108 (90 BASE) MCG/ACT inhaler, Inhale 2 puffs into the lungs every 6 (six) hours as needed for wheezing or shortness of breath., Disp: 1 Inhaler, Rfl: 0 .  azelastine (ASTELIN) 0.1 % nasal spray, Place 1-2 sprays into both nostrils 2 (two) times daily. Use in each nostril as directed (Patient taking differently: Place 1-2 sprays into both nostrils as needed. Use in each nostril as directed), Disp: 30 mL, Rfl: 3 .  esomeprazole (NEXIUM) 20 MG  capsule, , Disp: , Rfl: 2 .  losartan (COZAAR) 50 MG tablet, Take 1 tablet (50 mg total) by mouth daily., Disp: 90 tablet, Rfl: 0 .  metroNIDAZOLE (METROGEL) 0.75 % gel, Apply 1 application topically daily., Disp: 45 g, Rfl: 2 .  bacitracin-polymyxin b (POLYSPORIN) ophthalmic ointment, Place 1 application into the right eye 3 (three) times daily. X 7 days (Patient not taking: Reported on 12/11/2015), Disp: 3.5 g, Rfl: 0 .  cetirizine (ZYRTEC) 10 MG tablet, Take 10 mg by mouth as needed. , Disp: , Rfl:  .  omeprazole (PRILOSEC) 40 MG capsule, TAKE ONE CAPSULE BY MOUTH TWICE DAILY. TAKE 30 MINUTES BEFORE A MEAL. (Patient not taking: Reported on 12/11/2015), Disp: 60 capsule, Rfl: 0  Thank you for the opportunity to care for this patient.  Please do not hesitate to contact me with questions.

## 2015-12-11 NOTE — Assessment & Plan Note (Signed)
   Continue as needed albuterol. ? ?

## 2015-12-11 NOTE — Assessment & Plan Note (Addendum)
   Allergic to grass and tree pollen  Continue cetirizine 10 mg daily. May also use Claritin or Allegra 1 tablet daily  Start dymista 1 spray each nostril twice a day

## 2016-02-09 ENCOUNTER — Other Ambulatory Visit: Payer: Self-pay | Admitting: Family Medicine

## 2016-03-08 ENCOUNTER — Encounter: Payer: Self-pay | Admitting: Osteopathic Medicine

## 2016-03-08 ENCOUNTER — Ambulatory Visit (INDEPENDENT_AMBULATORY_CARE_PROVIDER_SITE_OTHER): Payer: BLUE CROSS/BLUE SHIELD | Admitting: Osteopathic Medicine

## 2016-03-08 VITALS — BP 134/83 | HR 76 | Ht 71.0 in | Wt 189.0 lb

## 2016-03-08 DIAGNOSIS — Z0000 Encounter for general adult medical examination without abnormal findings: Secondary | ICD-10-CM

## 2016-03-08 DIAGNOSIS — Z13220 Encounter for screening for lipoid disorders: Secondary | ICD-10-CM | POA: Diagnosis not present

## 2016-03-08 DIAGNOSIS — R7989 Other specified abnormal findings of blood chemistry: Secondary | ICD-10-CM

## 2016-03-08 DIAGNOSIS — Z131 Encounter for screening for diabetes mellitus: Secondary | ICD-10-CM | POA: Diagnosis not present

## 2016-03-08 DIAGNOSIS — R946 Abnormal results of thyroid function studies: Secondary | ICD-10-CM | POA: Diagnosis not present

## 2016-03-08 DIAGNOSIS — I10 Essential (primary) hypertension: Secondary | ICD-10-CM

## 2016-03-08 DIAGNOSIS — Z8719 Personal history of other diseases of the digestive system: Secondary | ICD-10-CM

## 2016-03-08 NOTE — Patient Instructions (Signed)
Plan to follow-up as directed for management/monitoring of chronic medical issues. Please don't hesitate to make an appointment sooner if you're having any acute concerns or problems!  We are ordering blood work today for routine screening for problems like high cholesterol and diabetes. We are also rechecking the thyroid which was abnormal a few months ago, you should get a call back about these results sometime in the next few days, please let us know if you don't hear anything to 3 days after you've had the blood work drawn. Full discussion of results, if these are abnormal, may require an office visit with your primary care doctor.   Please let your pharmacy know when you are running low on medications/refills (do not wait until you are out of medicines). Your pharmacy will send our office a request for the appropriate medications. Please allow our office 2-3 business days to process the needed refills.   At any visits to any of your specialists, please give them our clinic information so that they can forward [place] any records, including any tests which are done or changes to your medications. This allows all your physicians to communicate effectively, putting your primary care doctor at the center of your medical care and allowing [place] to effectively coordinate your care.   Let's plan to follow-up here in the office in 12 months for annual wellness exam. If you would like to, you can get lab work done a few days before that visit so that we can go over the results in person at your appointment. You do not need an appointment to go downstairs to the lab for blood draws, they should have the orders in the system for you and if there is any problem they can always call upstairs and we can solve it pretty quickly and get her blood drawn that day.   Please let us know if there is anything else we can do for you. Take care! -Dr. Loni Muse.

## 2016-03-08 NOTE — Progress Notes (Signed)
HPI: Robert Morris is a 40 y.o. male who presents to [place] today for chief complaint of:  Chief Complaint  Patient presents with  . Annual Exam     Needs form completed by Monday. Preventive care reviewed as below.    Past medical, social and family history reviewed: Past Medical History  Diagnosis Date  . GERD (gastroesophageal reflux disease)   . GERD (gastroesophageal reflux disease)   . Hypertension   . Pityriasis rosea    Past Surgical History  Procedure Laterality Date  . Shoulder surgery  1998    Left, repair of labrum  . Umbilical hernia repair  1996   Social History  Substance Use Topics  . Smoking status: Never Smoker   . Smokeless tobacco: Never Used  . Alcohol Use: 1.8 oz/week    3 Cans of beer per week   Family History  Problem Relation Age of Onset  . Hyperlipidemia Father   . Bipolar disorder Brother   . Asthma Mother     Current Outpatient Prescriptions  Medication Sig Dispense Refill  . albuterol (PROVENTIL HFA;VENTOLIN HFA) 108 (90 BASE) MCG/ACT inhaler Inhale 2 puffs into the lungs every 6 (six) hours as needed for wheezing or shortness of breath. 1 Inhaler 0  . azelastine (ASTELIN) 0.1 % nasal spray Place 1-2 sprays into both nostrils 2 (two) times daily. Use in each nostril as directed (Patient taking differently: Place 1-2 sprays into both nostrils as needed. Use in each nostril as directed) 30 mL 3  . esomeprazole (NEXIUM) 20 MG capsule   2  . losartan (COZAAR) 50 MG tablet TAKE 1 TABLET (50 MG TOTAL) BY MOUTH DAILY. 90 tablet 0  . metroNIDAZOLE (METROGEL) 0.75 % gel Apply 1 application topically daily. 45 g 2  . cetirizine (ZYRTEC) 10 MG tablet Take 10 mg by mouth as needed.      No current facility-administered medications for this visit.   Allergies  Allergen Reactions  . Iodine     Had a reaction to IV dye with skin rash  . Lisinopril     REACTION: cough      Review of  Systems: CONSTITUTIONAL:  No  fever, no chills, No  unintentional weight changes HEAD/EYES/EARS/NOSE/THROAT: No  headache, no vision change, no hearing change, No  sore throat, No  sinus pressure CARDIAC: No  chest pain, No  pressure, No palpitations, No  orthopnea RESPIRATORY: No  cough, No  shortness of breath/wheeze GASTROINTESTINAL: No  nausea, No  vomiting, No  abdominal pain, No  blood in stool, No  diarrhea, No  constipation  MUSCULOSKELETAL: No  myalgia/arthralgia GENITOURINARY: No  incontinence, No  abnormal genital bleeding/discharge SKIN: No  rash/wounds/concerning lesions HEM/ONC: No  easy bruising/bleeding, No  abnormal lymph node ENDOCRINE: No polyuria/polydipsia/polyphagia, No  heat/cold intolerance  NEUROLOGIC: No  weakness, No  dizziness, No  slurred speech PSYCHIATRIC: No  concerns with depression, No  concerns with anxiety, No sleep problems  Exam:  BP 134/83 mmHg  Pulse 76  Ht 5\' 11"  (1.803 m)  Wt 189 lb (85.73 kg)  BMI 26.37 kg/m2 Constitutional: VS see above. General Appearance: alert, well-developed, well-nourished, NAD Eyes: Normal lids and conjunctive, non-icteric sclera, PERRLA Ears, Nose, Mouth, Throat: MMM, Normal external inspection ears/nares/mouth/lips/gums, TM normal bilaterally. Pharynx no erythema, no exudate.  Neck: No masses, trachea midline. Mild symmetric thyroid enlargement, no tenderness/mass appreciated. No lymphadenopathy Respiratory: Normal respiratory effort. no wheeze, no rhonchi, no rales Cardiovascular: S1/S2 normal, no  murmur, no rub/gallop auscultated. RRR. No lower extremity edema. Gastrointestinal: Nontender, no masses. No hepatomegaly, no splenomegaly. No hernia appreciated. Bowel sounds normal. Rectal exam deferred.  Musculoskeletal: Gait normal. No clubbing/cyanosis of digits.  Neurological: No cranial nerve deficit on limited exam. Motor and sensation intact and symmetric Skin: warm, dry, intact. No rash/ulcer. No concerning nevi or  subq nodules on limited exam.   Psychiatric: Normal judgment/insight. Normal mood and affect.     ASSESSMENT/PLAN:  Annual physical exam - No increased risk for cancer screening based on FH. Declines HIV. Edcuated on importance of annual flu vaccine. Routine labs for Lipid/DM screen.  - Plan: CBC with Differential/Platelet, COMPLETE METABOLIC PANEL WITH GFR  Abnormal thyroid blood test - Plan: Thyroid Panel With TSH  Lipid screening - Plan: Lipid panel  Diabetes mellitus screening - Plan: COMPLETE METABOLIC PANEL WITH GFR  History of hiatal hernia  Essential hypertension   MALE PREVENTIVE CARE  ANNUAL SCREENING/COUNSELING Tobacco - Never  Alcohol - social drinker Diet/Exercise - HEALTHY HABITS DISCUSSED TO DECREASE CV RISK - regular exercise on and off Sexual Health - no concernd  STI - The patient denies history of sexually transmitted disease. INTERESTED IN STI TESTING - no Depression - PQH2 Negative Domestic violence concerns - no HTN SCREENING - SEE VITALS Vaccination status - SEE BELOW  INFECTIOUS DISEASE SCREENING HIV - all adults 15-65 - needs GC/CT - does not need HepC - born 09-1966 - does not need TB - does not need  DISEASE SCREENING Lipid -- needs DM2 - needs Osteoporosis - does not need AAA - does not need  CANCER SCREENING Lung - does not need Colon - does not need Prostate  - does not need  ADULT VACCINATION Influenza - annual - thinks he might next fall Td booster every 10 years - already has HPV - was not indicated Zoster -was not indicated Pneumonia - was not indicated    All questions were answered. Visit summary with updated medication list and pertinent instructions was printed for patient. ER/RTC precautions were reviewed with the patient. Return in about 1 year (around 03/08/2017), or sooner if needed and based on labs, and as directed by Dr. Madilyn Fireman, for [facility].

## 2016-03-09 LAB — CBC WITH DIFFERENTIAL/PLATELET
BASOS PCT: 1 %
Basophils Absolute: 66 cells/uL (ref 0–200)
EOS ABS: 330 {cells}/uL (ref 15–500)
Eosinophils Relative: 5 %
HCT: 45 % (ref 38.5–50.0)
Hemoglobin: 15.7 g/dL (ref 13.2–17.1)
LYMPHS PCT: 24 %
Lymphs Abs: 1584 cells/uL (ref 850–3900)
MCH: 29.8 pg (ref 27.0–33.0)
MCHC: 34.9 g/dL (ref 32.0–36.0)
MCV: 85.6 fL (ref 80.0–100.0)
MONOS PCT: 9 %
MPV: 9.5 fL (ref 7.5–12.5)
Monocytes Absolute: 594 cells/uL (ref 200–950)
Neutro Abs: 4026 cells/uL (ref 1500–7800)
Neutrophils Relative %: 61 %
PLATELETS: 205 10*3/uL (ref 140–400)
RBC: 5.26 MIL/uL (ref 4.20–5.80)
RDW: 13.6 % (ref 11.0–15.0)
WBC: 6.6 10*3/uL (ref 3.8–10.8)

## 2016-03-09 LAB — COMPLETE METABOLIC PANEL WITH GFR
ALT: 18 U/L (ref 9–46)
AST: 15 U/L (ref 10–40)
Albumin: 4.3 g/dL (ref 3.6–5.1)
Alkaline Phosphatase: 56 U/L (ref 40–115)
BUN: 15 mg/dL (ref 7–25)
CHLORIDE: 102 mmol/L (ref 98–110)
CO2: 27 mmol/L (ref 20–31)
CREATININE: 1.15 mg/dL (ref 0.60–1.35)
Calcium: 9.2 mg/dL (ref 8.6–10.3)
GFR, Est Non African American: 79 mL/min (ref >=60)
Glucose, Bld: 83 mg/dL (ref 65–99)
Potassium: 4.2 mmol/L (ref 3.5–5.3)
Sodium: 138 mmol/L (ref 135–146)
Total Bilirubin: 0.8 mg/dL (ref 0.2–1.2)
Total Protein: 7 g/dL (ref 6.1–8.1)

## 2016-03-09 LAB — LIPID PANEL
CHOL/HDL RATIO: 5 ratio (ref <=5.0)
Cholesterol: 189 mg/dL (ref 125–200)
HDL: 38 mg/dL — AB (ref >=40)
LDL CALC: 108 mg/dL (ref <130)
TRIGLYCERIDES: 217 mg/dL — AB (ref <150)
VLDL: 43 mg/dL — AB (ref <30)

## 2016-03-09 LAB — THYROID PANEL WITH TSH
Free Thyroxine Index: 2.1 (ref 1.4–3.8)
T3 Uptake: 29 % (ref 22–35)
T4, Total: 7.1 ug/dL (ref 4.5–12.0)
TSH: 6.23 mIU/L — ABNORMAL HIGH (ref 0.40–4.50)

## 2016-06-01 ENCOUNTER — Other Ambulatory Visit: Payer: Self-pay | Admitting: *Deleted

## 2016-06-01 MED ORDER — LOSARTAN POTASSIUM 50 MG PO TABS: ORAL_TABLET | ORAL | Status: DC

## 2016-06-21 ENCOUNTER — Other Ambulatory Visit: Payer: Self-pay | Admitting: *Deleted

## 2016-06-21 MED ORDER — PANTOPRAZOLE SODIUM 20 MG PO TBEC: 20.0000 mg | DELAYED_RELEASE_TABLET | Freq: Every day | ORAL | 4 refills | Status: DC

## 2016-08-29 ENCOUNTER — Other Ambulatory Visit: Payer: Self-pay | Admitting: Family Medicine

## 2016-08-30 ENCOUNTER — Encounter: Payer: Self-pay | Admitting: Sports Medicine

## 2016-08-30 ENCOUNTER — Ambulatory Visit (INDEPENDENT_AMBULATORY_CARE_PROVIDER_SITE_OTHER): Payer: 59 | Admitting: Sports Medicine

## 2016-08-30 ENCOUNTER — Ambulatory Visit (INDEPENDENT_AMBULATORY_CARE_PROVIDER_SITE_OTHER): Payer: 59

## 2016-08-30 VITALS — BP 146/97 | HR 88 | Wt 194.0 lb

## 2016-08-30 DIAGNOSIS — R0789 Other chest pain: Secondary | ICD-10-CM

## 2016-08-30 DIAGNOSIS — R079 Chest pain, unspecified: Secondary | ICD-10-CM

## 2016-08-30 DIAGNOSIS — I10 Essential (primary) hypertension: Secondary | ICD-10-CM | POA: Diagnosis not present

## 2016-08-30 DIAGNOSIS — M5030 Other cervical disc degeneration, unspecified cervical region: Secondary | ICD-10-CM

## 2016-08-30 DIAGNOSIS — E781 Pure hyperglyceridemia: Secondary | ICD-10-CM

## 2016-08-30 MED ORDER — MELOXICAM 15 MG PO TABS: ORAL_TABLET | ORAL | 3 refills | Status: DC

## 2016-08-30 MED ORDER — VALSARTAN 320 MG PO TABS: 320.0000 mg | ORAL_TABLET | Freq: Every day | ORAL | 3 refills | Status: DC

## 2016-08-30 NOTE — Assessment & Plan Note (Signed)
Switching to high-dose valsartan

## 2016-08-30 NOTE — Progress Notes (Signed)
   Subjective:    I'm seeing this patient as a consultation for:  Dr. Beatrice Lecher  CC: Multiple complaints  HPI: Arm numbness: Bilateral, tells me the entire arm and hand go numb at night. Minimal neck pain.  Chest pain: Present for several hours, localized in the left upper chest underneath the pectoralis major, also with some pain with deep breathing. He did have a stress test a year ago that was negative. Pain is nonexertional, doesn't radiate, no associate nausea or diaphoresis, no presyncope.  Hypertension: Only on losartan, blood pressure is still elevated.  Hyperlipidemia: Triglycerides, has been on fish oil but has not yet had triglycerides rechecked.  Past medical history:  Negative.  See flowsheet/record as well for more information.  Surgical history: Negative.  See flowsheet/record as well for more information.  Family history: Negative.  See flowsheet/record as well for more information.  Social history: Negative.  See flowsheet/record as well for more information.  Allergies, and medications have been entered into the medical record, reviewed, and no changes needed.   Review of Systems: No headache, visual changes, nausea, vomiting, diarrhea, constipation, dizziness, abdominal pain, skin rash, fevers, chills, night sweats, weight loss, swollen lymph nodes, body aches, joint swelling, muscle aches, chest pain, shortness of breath, mood changes, visual or auditory hallucinations.   Objective:   General: Well Developed, well nourished, and in no acute distress.  Neuro/Psych: Alert and oriented x3, extra-ocular muscles intact, able to move all 4 extremities, sensation grossly intact. Skin: Warm and dry, no rashes noted.  Respiratory: Not using accessory muscles, speaking in full sentences, trachea midline.  Cardiovascular: Pulses palpable, no extremity edema.Regular rate and rhythm, no murmurs, rubs, gallops. Tender to palpation in the left upper chest consistent with  chest wall pain Abdomen: Does not appear distended. Neck: Negative spurling's Full neck range of motion Grip strength and sensation normal in bilateral hands Strength good C4 to T1 distribution No sensory change to C4 to T1 Reflexes normal  12-lead ECG personally reviewed, normal sinus rhythm with an RSR pattern in V1, normal axis, normal rate of 87 beats per minute with no ST or PR segment changes.  Impression and Recommendations:   This case required medical decision making of moderate complexity.  Chest wall pain ECG is unremarkable, had a negative stress test last year. Chest pain is nonexertional Pain is also reproducible with palpation over the left costochondral junction. Adding meloxicam, we are going to check some blood work. We did discuss risk factor modification as well.  HYPERTRIGLYCERIDEMIA Rechecking triglycerides. We may need to add fenofibrate.  Essential hypertension Switching to high-dose valsartan  DDD (degenerative disc disease), cervical X-rays, meloxicam, neck rehabilitation exercises. Return in one month, MRI for interventional planning if no better, he does have bilateral radicular symptoms.

## 2016-08-30 NOTE — Assessment & Plan Note (Signed)
Rechecking triglycerides. We may need to add fenofibrate.

## 2016-08-30 NOTE — Assessment & Plan Note (Signed)
ECG is unremarkable, had a negative stress test last year. Chest pain is nonexertional Pain is also reproducible with palpation over the left costochondral junction. Adding meloxicam, we are going to check some blood work. We did discuss risk factor modification as well.

## 2016-08-30 NOTE — Assessment & Plan Note (Signed)
X-rays, meloxicam, neck rehabilitation exercises. Return in one month, MRI for interventional planning if no better, he does have bilateral radicular symptoms.

## 2016-08-31 LAB — LIPID PANEL
Cholesterol: 218 mg/dL — ABNORMAL HIGH (ref 125–200)
HDL: 40 mg/dL (ref >=40)
LDL Cholesterol: 126 mg/dL (ref <130)
Total CHOL/HDL Ratio: 5.5 Ratio — ABNORMAL HIGH (ref <=5.0)
Triglycerides: 262 mg/dL — ABNORMAL HIGH (ref <150)
VLDL: 52 mg/dL — ABNORMAL HIGH (ref <30)

## 2016-08-31 LAB — COMPREHENSIVE METABOLIC PANEL WITH GFR
ALT: 17 U/L (ref 9–46)
AST: 15 U/L (ref 10–40)
Albumin: 4.4 g/dL (ref 3.6–5.1)
Alkaline Phosphatase: 53 U/L (ref 40–115)
Calcium: 9.4 mg/dL (ref 8.6–10.3)
Creat: 1.2 mg/dL (ref 0.60–1.35)
Glucose, Bld: 96 mg/dL (ref 65–99)
Sodium: 140 mmol/L (ref 135–146)
Total Bilirubin: 0.7 mg/dL (ref 0.2–1.2)

## 2016-08-31 LAB — COMPREHENSIVE METABOLIC PANEL
BUN: 14 mg/dL (ref 7–25)
CO2: 28 mmol/L (ref 20–31)
Chloride: 103 mmol/L (ref 98–110)
Potassium: 4.7 mmol/L (ref 3.5–5.3)
Total Protein: 6.8 g/dL (ref 6.1–8.1)

## 2016-09-01 LAB — D-DIMER, QUANTITATIVE: D-Dimer, Quant: <0.19 mcg/mL FEU (ref <0.50)

## 2016-11-14 HISTORY — PX: ESOPHAGOGASTRODUODENOSCOPY: SHX1529

## 2016-12-08 ENCOUNTER — Other Ambulatory Visit: Payer: Self-pay | Admitting: Family Medicine

## 2016-12-25 ENCOUNTER — Other Ambulatory Visit: Payer: Self-pay | Admitting: Sports Medicine

## 2016-12-25 DIAGNOSIS — I10 Essential (primary) hypertension: Secondary | ICD-10-CM

## 2017-01-26 ENCOUNTER — Other Ambulatory Visit: Payer: Self-pay | Admitting: Family Medicine

## 2017-01-26 DIAGNOSIS — I10 Essential (primary) hypertension: Secondary | ICD-10-CM

## 2017-02-19 ENCOUNTER — Other Ambulatory Visit: Payer: Self-pay | Admitting: Sports Medicine

## 2017-02-19 DIAGNOSIS — R0789 Other chest pain: Secondary | ICD-10-CM

## 2017-03-09 ENCOUNTER — Encounter: Payer: Self-pay | Admitting: Family Medicine

## 2017-03-09 ENCOUNTER — Ambulatory Visit (INDEPENDENT_AMBULATORY_CARE_PROVIDER_SITE_OTHER): Payer: 59 | Admitting: Family Medicine

## 2017-03-09 VITALS — BP 122/84 | HR 94 | Ht 69.5 in | Wt 194.0 lb

## 2017-03-09 DIAGNOSIS — G8929 Other chronic pain: Secondary | ICD-10-CM

## 2017-03-09 DIAGNOSIS — M791 Myalgia: Secondary | ICD-10-CM

## 2017-03-09 DIAGNOSIS — Z0000 Encounter for general adult medical examination without abnormal findings: Secondary | ICD-10-CM | POA: Diagnosis not present

## 2017-03-09 DIAGNOSIS — M545 Low back pain, unspecified: Secondary | ICD-10-CM

## 2017-03-09 DIAGNOSIS — M7918 Myalgia, other site: Secondary | ICD-10-CM

## 2017-03-09 NOTE — Progress Notes (Signed)
Subjective:    Patient ID: Robert Morris, male    DOB: 08/30/1976, 41 y.o.   MRN: [id number]  HPI photic-year-old male here today for complete physical exam. He does need a form completed for the town of [place] for his wellness discount verification. He does have a couple concerns that he would like to discuss today.  He has been having some upper back pain between the shoulder blades and the spine bilaterally. He says sometimes it'll get somewhat uncomfortable, feels like a burning sensation. He's been using a heating pad. He's also been taking meloxicam for it. He also complains of low back pain. He did play college baseball and is starting to feel like he is getting some side effects from that. He notices when he holds his son for a long period time he'll start to get some soreness in his back and also with doing things like yard work that never used to bother him he is getting soreness later in the day and the following day. Again he has been using Mobic as needed. He has not been exercising lately. He denies any radicular symptoms.  He finally saw GI for his reflux. He was having some breakthrough symptoms taking his Prilosec. They have planned him for an endoscopy and they switched him to DEXA lot. They gave him samples. He says that those have actually been very helpful. These also been taking meloxicam recently.  Review of Systems NO CP, SOB.   BP (!) 140/106   Pulse 94   Ht 5' 9.5" (1.765 m)   Wt 194 lb (88 kg)   SpO2 99%   BMI 28.24 kg/m     Allergies  Allergen Reactions  . Iodine     Had a reaction to IV dye with skin rash  . Lisinopril     REACTION: cough    Past Medical History:  Diagnosis Date  . GERD (gastroesophageal reflux disease)   . GERD (gastroesophageal reflux disease)   . Hypertension   . Pityriasis rosea     Past Surgical History:  Procedure Laterality Date  . SHOULDER SURGERY  1998   Left, repair of labrum  . UMBILICAL HERNIA REPAIR  1996     Social History   Social History  . Marital status: Married    Spouse name: Stacie  . Number of children: 1  . Years of education: N/A   Occupational History  . Not on file.   Social History Main Topics  . Smoking status: Never Smoker  . Smokeless tobacco: Never Used  . Alcohol use 1.8 oz/week    3 Cans of beer per week  . Drug use: No  . Sexual activity: Yes    Partners: Female   Other Topics Concern  . Not on file   Social History Narrative   Occ exercise. Works for [employer].     Family History  Problem Relation Age of Onset  . Hyperlipidemia Father   . Bipolar disorder Brother   . Asthma Mother     Outpatient Encounter Prescriptions as of 03/09/2017  Medication Sig  . DEXILANT 60 MG capsule Take 60 mg by mouth daily.  . cetirizine (ZYRTEC) 10 MG tablet Take 10 mg by mouth as needed.   . meloxicam (MOBIC) 15 MG tablet ONE TAB IN AM WITH BREAKFAST FOR 2 WEEKS, THEN DAILY AS NEEDED FOR PAIN.  . metroNIDAZOLE (METROGEL) 0.75 % gel APPLY 1 APPLICATION TOPICALLY DAILY.  Marland Kitchen omeprazole (PRILOSEC) 40 MG capsule  Take 40 mg by mouth daily.  . valsartan (DIOVAN) 320 MG tablet Take 1 tablet (320 mg total) by mouth daily.  . [DISCONTINUED] azelastine (ASTELIN) 0.1 % nasal spray Place 1-2 sprays into both nostrils 2 (two) times daily. Use in each nostril as directed (Patient taking differently: Place 1-2 sprays into both nostrils as needed. Use in each nostril as directed)   No facility-administered encounter medications on file as of 03/09/2017.          Objective:   Physical Exam  Constitutional: He is oriented to person, place, and time. He appears well-developed and well-nourished.  HENT:  Head: Normocephalic and atraumatic.  Right Ear: External ear normal.  Left Ear: External ear normal.  Nose: Nose normal.  Mouth/Throat: Oropharynx is clear and moist.  Eyes: Conjunctivae and EOM are normal. Pupils are equal, round, and reactive to light.  Neck:  Normal range of motion. Neck supple. No thyromegaly present.  Cardiovascular: Normal rate, regular rhythm, normal heart sounds and intact distal pulses.   Pulmonary/Chest: Effort normal and breath sounds normal.  Abdominal: Soft. Bowel sounds are normal. He exhibits no distension and no mass. There is no tenderness. There is no rebound and no guarding.  Musculoskeletal: Normal range of motion.  Nontender over the cervical thoracic or lumbar spine. Little bit of tenderness over the rhomboids between the midthoracic spine and the shoulder blades bilaterally. Negative straight leg raise bilaterally. Hip, knee, ankle strength is 5 out of 5. Patellar reflexes 1+.  Lymphadenopathy:    He has no cervical adenopathy.  Neurological: He is alert and oriented to person, place, and time. He has normal reflexes.  Skin: Skin is warm and dry.  Psychiatric: He has a normal mood and affect. His behavior is normal. Judgment and thought content normal.        Assessment & Plan:  CPE Keep up a regular exercise program and make sure you are eating a healthy diet Try to eat 4 servings of dairy a day, or if you are lactose intolerant take a calcium with vitamin D daily.  Your vaccines are up to date.   Upper and low back pain-we'll give handout for upper and low back stretches. Also reviewed a couple of stretches that are not on the sheets for him to work on. Because these been having some GI issues and increased symptoms with his GERD I asked him to discontinue the meloxicam and just use Tylenol arthritis as well as the heating pad. We also could consider a muscle relaxer at bedtime if needed he will try to stretch his first and call me back if he wants to try the muscle relaxer.

## 2017-05-30 ENCOUNTER — Encounter: Payer: Self-pay | Admitting: Family Medicine

## 2017-05-30 ENCOUNTER — Other Ambulatory Visit: Payer: Self-pay | Admitting: Family Medicine

## 2017-05-30 DIAGNOSIS — I10 Essential (primary) hypertension: Secondary | ICD-10-CM

## 2017-05-31 ENCOUNTER — Ambulatory Visit (INDEPENDENT_AMBULATORY_CARE_PROVIDER_SITE_OTHER): Payer: 59

## 2017-05-31 ENCOUNTER — Ambulatory Visit (INDEPENDENT_AMBULATORY_CARE_PROVIDER_SITE_OTHER): Payer: 59 | Admitting: Family Medicine

## 2017-05-31 ENCOUNTER — Encounter: Payer: Self-pay | Admitting: Family Medicine

## 2017-05-31 VITALS — BP 126/83 | HR 70 | Wt 193.0 lb

## 2017-05-31 DIAGNOSIS — M549 Dorsalgia, unspecified: Secondary | ICD-10-CM

## 2017-05-31 DIAGNOSIS — M546 Pain in thoracic spine: Secondary | ICD-10-CM

## 2017-05-31 DIAGNOSIS — G8929 Other chronic pain: Secondary | ICD-10-CM

## 2017-05-31 MED ORDER — CYCLOBENZAPRINE HCL 10 MG PO TABS: 5.0000 mg | ORAL_TABLET | Freq: Every evening | ORAL | 0 refills | Status: DC | PRN

## 2017-05-31 NOTE — Patient Instructions (Signed)
Call if not improving after 3-4 weeks.

## 2017-05-31 NOTE — Progress Notes (Signed)
Subjective:    Patient ID: Robert Morris, male    DOB: [birth year], 41 y.o.   MRN: [id number]  HPI 41 year old male comes in today complaining of left upper back pain. Actually just saw him back in April for a physical and at that time he was experiencing some back pain. He says really for about the last year to year and a half he's had been having episodic flares. It often affects the upper back almost always on the left side the most. He said this past Monday he was feeling well and he laid back in bed and actually tilted his head backwards and his CBC did he started to feel immediate sharp pain in that left upper back between the shoulder blade and spine. Ever since then it's been sore and spasming. He did play golf the day before this occurred and said he did not have any difficulty or discomfort while playing. He says sometimes the pain actually feels like it's going down his neck as well and over the trapezius muscles. He says even his chest is been a little bit snore for example when he sneezes. He did not take any medications for the pain this time. He did try using a heating pad a couple of times. And he got his wife to try to massage it. He denies any numbness or tingling into the arms.   Review of Systems    BP 126/83   Pulse 70   Wt 193 lb (87.5 kg)   BMI 28.09 kg/m     Allergies  Allergen Reactions  . Iodine     Had a reaction to IV dye with skin rash  . Lisinopril     REACTION: cough    Past Medical History:  Diagnosis Date  . GERD (gastroesophageal reflux disease)   . GERD (gastroesophageal reflux disease)   . Hypertension   . Pityriasis rosea     Past Surgical History:  Procedure Laterality Date  . SHOULDER SURGERY  1998   Left, repair of labrum  . UMBILICAL HERNIA REPAIR  1996    Social History   Social History  . Marital status: Married    Spouse name: Stacie  . Number of children: 1  . Years of education: N/A   Occupational History  . Not on  file.   Social History Main Topics  . Smoking status: Never Smoker  . Smokeless tobacco: Never Used  . Alcohol use 1.8 oz/week    3 Cans of beer per week  . Drug use: No  . Sexual activity: Yes    Partners: Female   Other Topics Concern  . Not on file   Social History Narrative   Occ exercise. Works for [employer].     Family History  Problem Relation Age of Onset  . Hyperlipidemia Father   . Bipolar disorder Brother   . Asthma Mother     Outpatient Encounter Prescriptions as of 05/31/2017  Medication Sig  . cyclobenzaprine (FLEXERIL) 10 MG tablet Take 0.5-1 tablets (5-10 mg total) by mouth at bedtime as needed for muscle spasms.  . DEXILANT 60 MG capsule Take 60 mg by mouth daily.  . meloxicam (MOBIC) 15 MG tablet ONE TAB IN AM WITH BREAKFAST FOR 2 WEEKS, THEN DAILY AS NEEDED FOR PAIN.  . metroNIDAZOLE (METROGEL) 0.75 % gel APPLY 1 APPLICATION TOPICALLY DAILY.  . valsartan (DIOVAN) 320 MG tablet TAKE 1 TABLET (320 MG TOTAL) BY MOUTH DAILY.  . cetirizine (ZYRTEC) 10  MG tablet Take 10 mg by mouth as needed.    No facility-administered encounter medications on file as of 05/31/2017.          Objective:   Physical Exam  Constitutional: He is oriented to person, place, and time. He appears well-developed and well-nourished.  HENT:  Head: Normocephalic and atraumatic.  Eyes: Conjunctivae and EOM are normal.  Cardiovascular: Normal rate.   Pulmonary/Chest: Effort normal.  Musculoskeletal:  Nontender over the cervical or thoracic spine. He does have some tenderness between the left scapula and spine and I'm actually able to palpate some knots in the muscle tissue. Shoulders with normal range of motion. Neck with normal range of motion though he does have discomfort with full flexion full extension and full rotation right and left but it is symmetric.  Neurological: He is alert and oriented to person, place, and time.  Skin: Skin is dry. No pallor.  Psychiatric: He  has a normal mood and affect. His behavior is normal.  Vitals reviewed.         Assessment & Plan:  Left upper back pain-mostly muscle spasm. Did send of her prescription for Flexeril this one of them to try in the evenings see if this is helpful or not. Recommend formal physical therapy at this point. I think it'll be much more helpful than some stretches he's been doing at home. Continue with heat. Also recommend massage therapy as an option. Because this is been going on repetitively for your half also go ahead and get thoracic spine films. Also recommend to take an anti-inflammatory. He says he Artie has Mobic at home and she is to take that.

## 2017-05-31 NOTE — Telephone Encounter (Signed)
I think we try to call him yesterday to see if he could be worked in. Can also call him today and see if he would like to be worked in. I will go ahead and send in Flexeril in case he can't come in and he can at least try that through the week and see if it's helpful.

## 2017-07-05 ENCOUNTER — Telehealth: Payer: Self-pay | Admitting: Family Medicine

## 2017-07-05 MED ORDER — LOSARTAN POTASSIUM 100 MG PO TABS: 100.0000 mg | ORAL_TABLET | Freq: Every day | ORAL | 0 refills | Status: DC

## 2017-07-05 NOTE — Telephone Encounter (Signed)
Okay, new perception sent for losartan in place of the valsartan. Medication list updated. He will need to come in in 3-4 weeks for nurse blood pressure check just to make sure that it's working as well as the old one.  Beatrice Lecher, MD

## 2017-07-05 NOTE — Telephone Encounter (Signed)
Pt called and states there is a recall on his his Valsartan and needs something different called into [facility]

## 2017-07-05 NOTE — Telephone Encounter (Signed)
Pt advised of new Rx, will call to schedule NV.

## 2017-09-08 IMAGING — DX DG CHEST 2V
2 series · 2 of 2 positions shown · non-contrast
Comparison: 05/19/2015

CLINICAL DATA: Left chest wall pain

EXAM:
CHEST  2 VIEW

[chest pa]
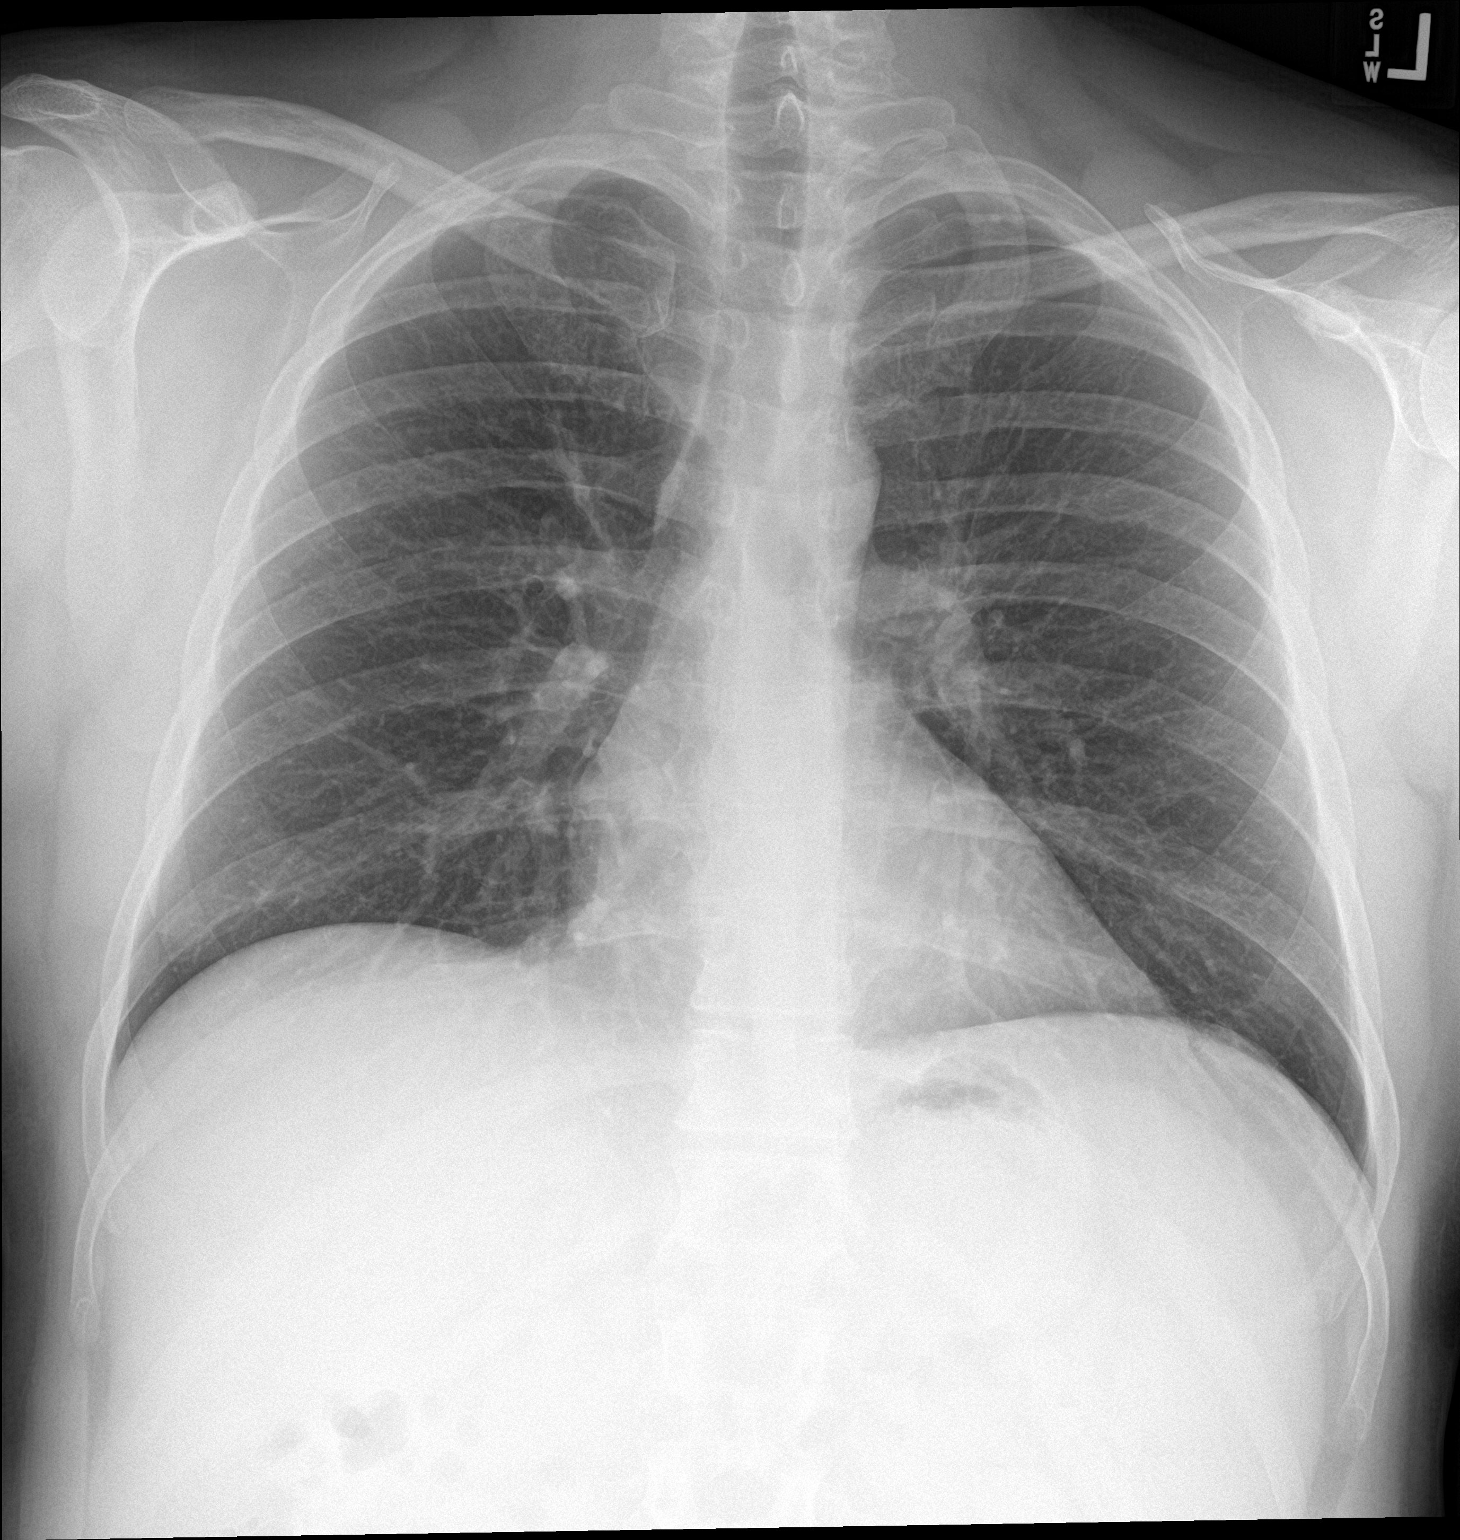

[chest lat]
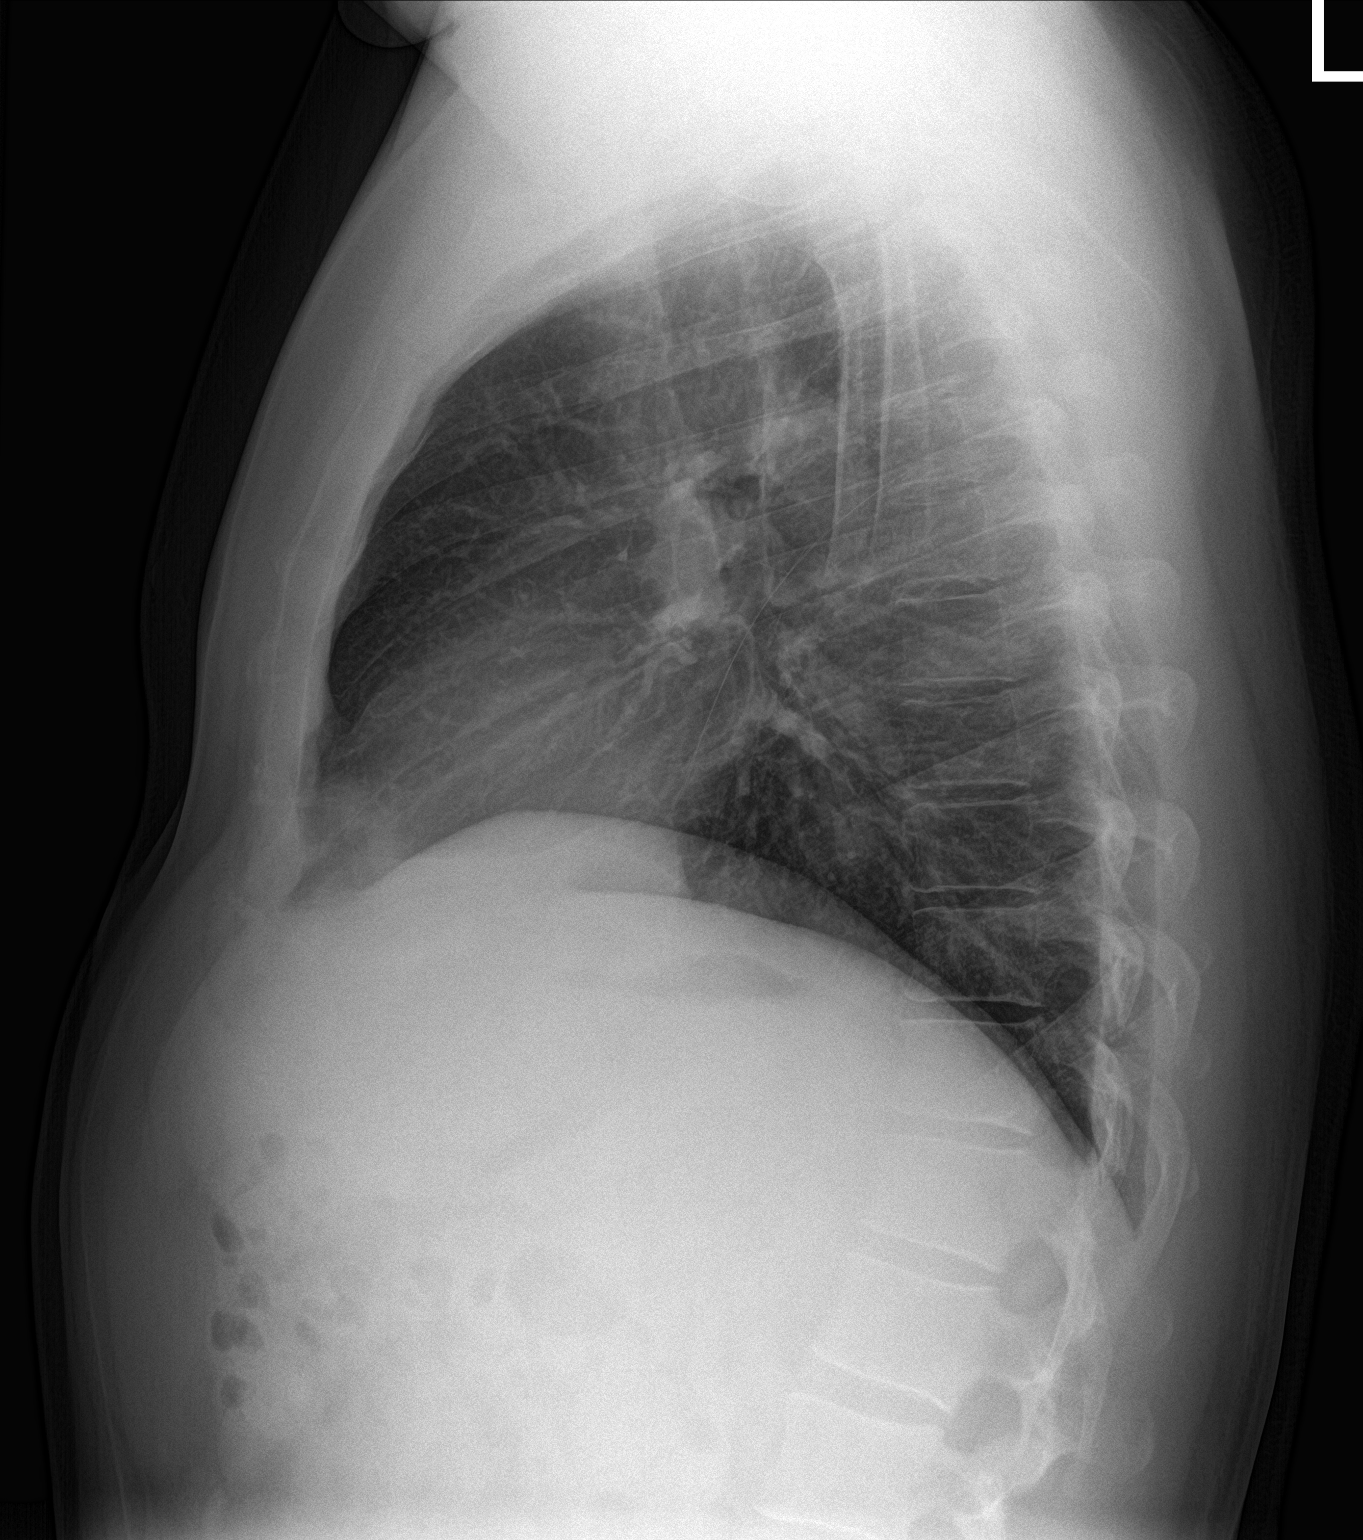

[2 of 2 positions shown; findings below may reference images not displayed]

FINDINGS: The heart size and mediastinal contours are within normal limits.
Both lungs are clear. The visualized skeletal structures are
unremarkable.
IMPRESSION: No active cardiopulmonary disease.

## 2017-09-08 IMAGING — DX DG CERVICAL SPINE COMPLETE 4+V
5 series · 5 of 5 positions shown · non-contrast
Comparison: None.

CLINICAL DATA: Left-sided neck pain for 6 months, no injury

EXAM:
CERVICAL SPINE - COMPLETE 4+ VIEW

[c-spine lat]
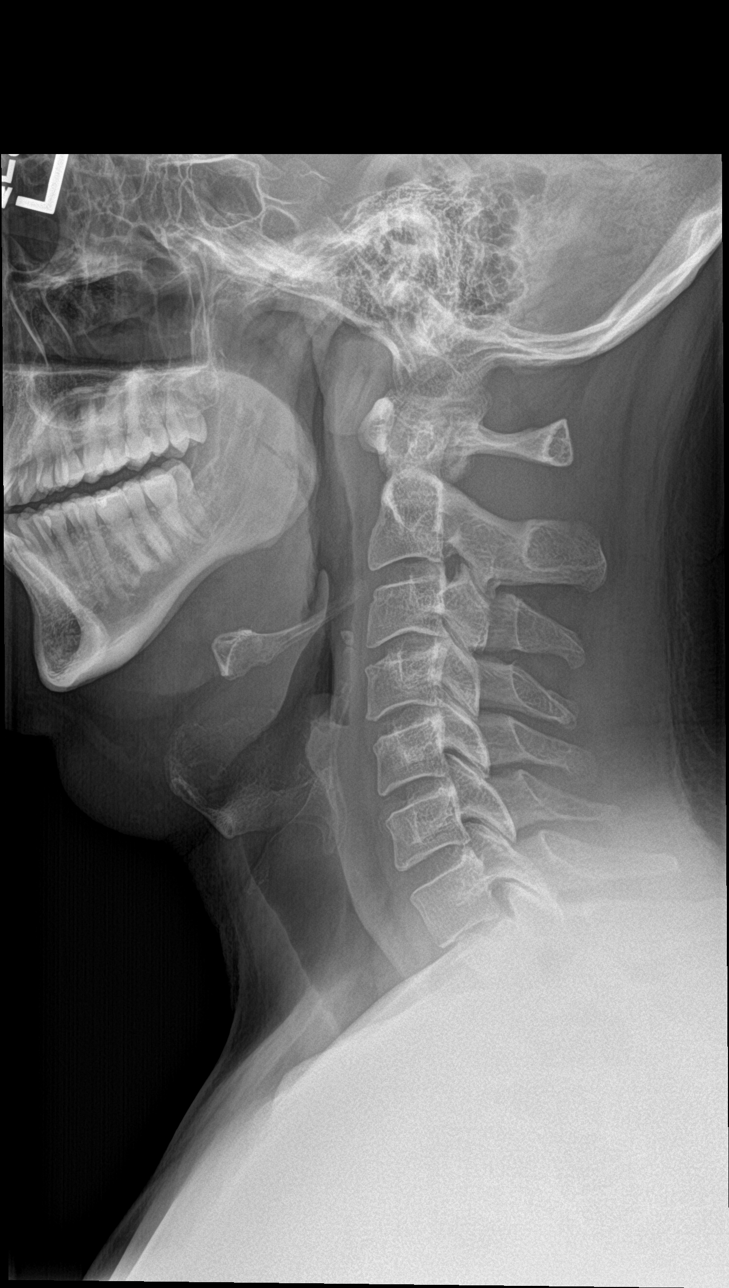

[c-spine obl (1 of 2)]
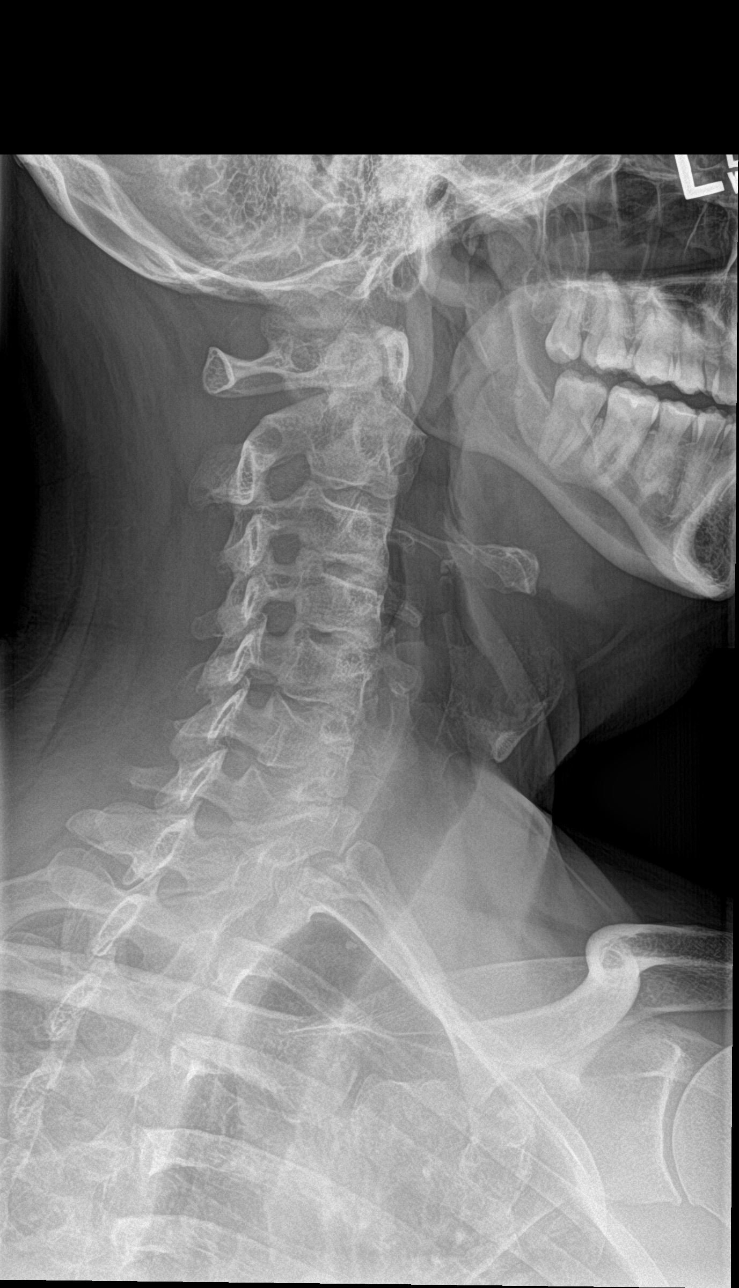

[c-spine obl (2 of 2)]
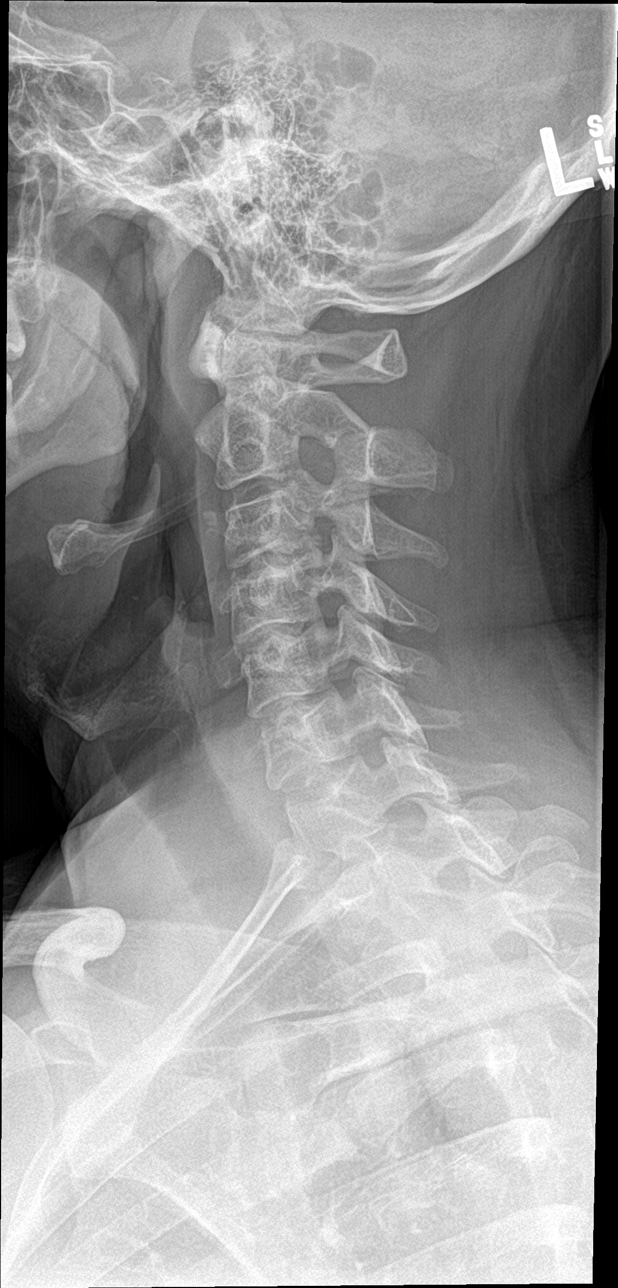

[c-spine ap]
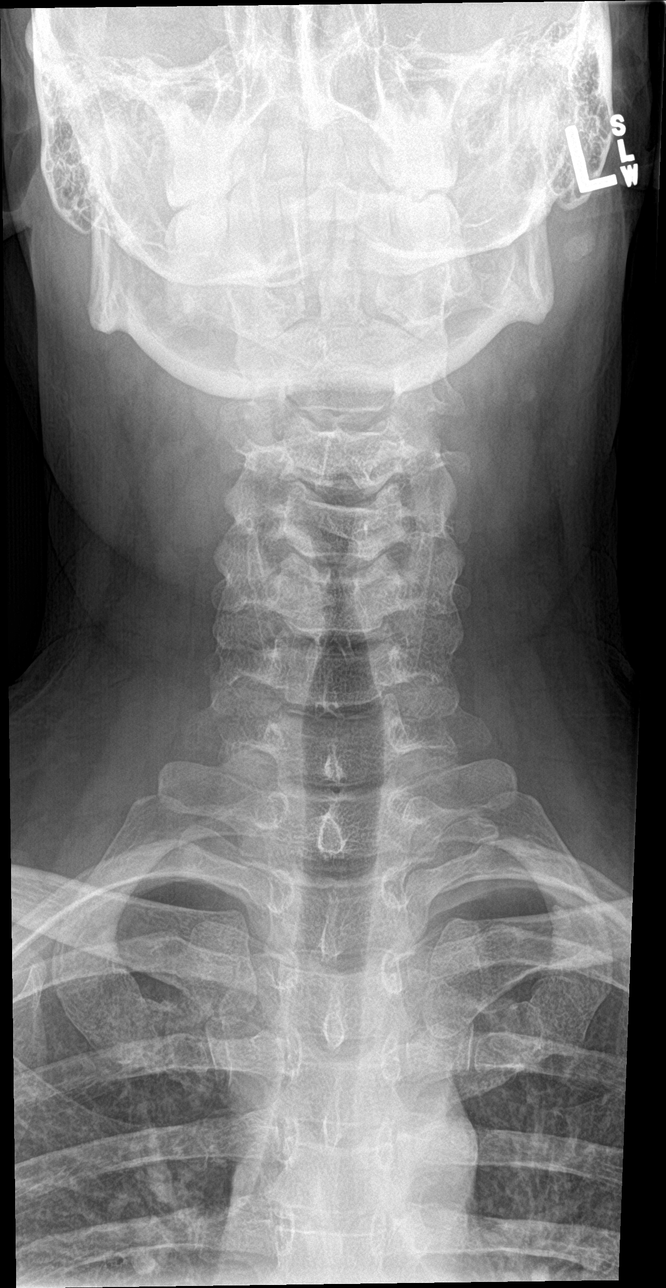

[c-spine open mouth]
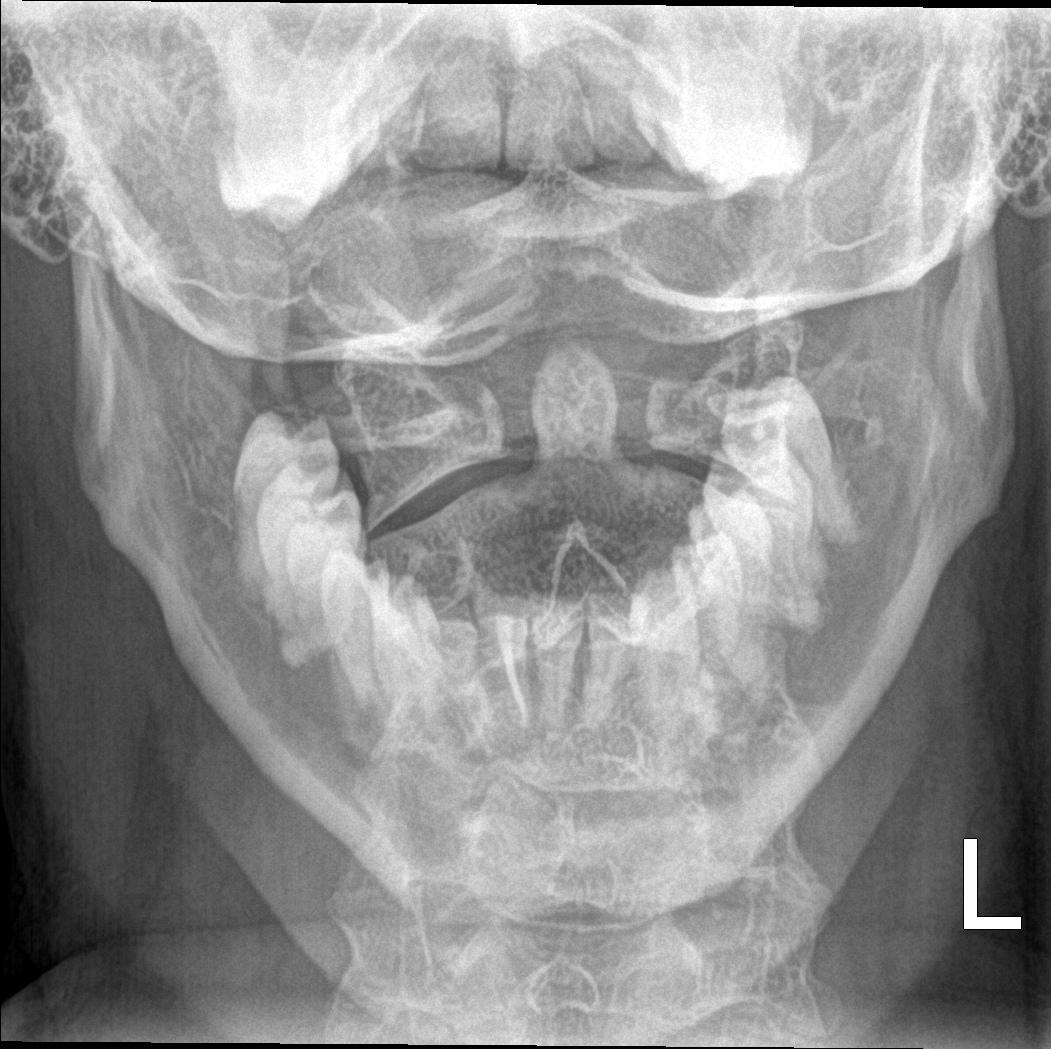

[5 of 5 positions shown; findings below may reference images not displayed]

FINDINGS: The cervical vertebrae are in normal alignment. Intervertebral disc
spaces appear normal. No prevertebral soft tissue swelling is seen.
On oblique views, the foramina are widely patent. The odontoid
process is intact. The lung apices are clear.
IMPRESSION: Normal alignment.  Normal intervertebral disc spaces.

## 2017-09-30 ENCOUNTER — Other Ambulatory Visit: Payer: Self-pay | Admitting: Family Medicine

## 2017-11-10 ENCOUNTER — Other Ambulatory Visit: Payer: Self-pay | Admitting: Family Medicine

## 2017-11-28 ENCOUNTER — Telehealth: Payer: Self-pay | Admitting: Family Medicine

## 2017-11-28 NOTE — Telephone Encounter (Signed)
Called patient and left a message for him to schedule a f/u appt with Dr.Metheney for BP and labs -

## 2017-12-10 ENCOUNTER — Other Ambulatory Visit: Payer: Self-pay | Admitting: Family Medicine

## 2018-03-02 ENCOUNTER — Other Ambulatory Visit: Payer: Self-pay | Admitting: Sports Medicine

## 2018-03-02 DIAGNOSIS — R0789 Other chest pain: Secondary | ICD-10-CM

## 2018-03-02 NOTE — Telephone Encounter (Signed)
I haven't seen him in 2 years, to PCP.

## 2018-03-29 ENCOUNTER — Other Ambulatory Visit: Payer: Self-pay | Admitting: Family Medicine

## 2018-05-01 ENCOUNTER — Emergency Department
Admission: EM | Admit: 2018-05-01 | Discharge: 2018-05-01 | Disposition: A | Discharge status: Home / Self Care | Payer: 59 | Source: Home / Self Care | Attending: Family Medicine | Admitting: Family Medicine

## 2018-05-01 ENCOUNTER — Other Ambulatory Visit: Payer: Self-pay

## 2018-05-01 DIAGNOSIS — J029 Acute pharyngitis, unspecified: Secondary | ICD-10-CM

## 2018-05-01 LAB — POCT RAPID STREP A (OFFICE): Rapid Strep A Screen: NEGATIVE

## 2018-05-01 MED ORDER — AZITHROMYCIN 250 MG PO TABS: ORAL_TABLET | ORAL | 0 refills | Status: DC

## 2018-05-01 NOTE — ED Triage Notes (Signed)
Started last night with sore throat, this am chills, continued sore throat.  Took advil cold and sinus this am and just before coming in.

## 2018-05-01 NOTE — Discharge Instructions (Addendum)
Take plain guaifenesin (1200mg  extended release tabs such as Mucinex) twice daily, with plenty of water, for cough and congestion.  Get adequate rest.   May use Afrin nasal spray (or generic oxymetazoline) each morning for about 5 days and then discontinue.  Also recommend using saline nasal spray several times daily and saline nasal irrigation (AYR is a common brand).  Use Flonase nasal spray each morning after using Afrin nasal spray and saline nasal irrigation. Try warm salt water gargles for sore throat.  Stop all antihistamines for now, and other non-prescription cough/cold preparations. May take Ibuprofen 200mg , 4 tabs every 8 hours with food for sore throat, body aches, headache, etc. May take Delsym Cough Suppressant at bedtime for nighttime cough.  Begin Azithromycin if not improving about one week or if persistent fever develops

## 2018-05-01 NOTE — ED Provider Notes (Signed)
[name]    CSN: [id number] Arrival date & time: 05/01/18  1555     History   Chief Complaint Chief Complaint  Patient presents with  . Sore Throat    HPI Robert Morris is a 42 y.o. male.   Last night patient developed sore throat, fatigue, and chills.  His symptoms were worse this morning.  No cough or nasal congestion.  The history is provided by the patient.    Past Medical History:  Diagnosis Date  . GERD (gastroesophageal reflux disease)   . GERD (gastroesophageal reflux disease)   . Hypertension   . Pityriasis rosea     Patient Active Problem List   Diagnosis Date Noted  . Chest wall pain 08/30/2016  . DDD (degenerative disc disease), cervical 08/30/2016  . Essential hypertension 03/08/2016  . Other seasonal allergic rhinitis 12/11/2015  . Allergic reaction 12/11/2015  . Shortness of breath 12/11/2015  . ELEVATED BP READING WITHOUT DX HYPERTENSION 03/11/2011  . GERD 07/02/2010  . HYPERTRIGLYCERIDEMIA 10/29/2008    Past Surgical History:  Procedure Laterality Date  . SHOULDER SURGERY  1998   Left, repair of labrum  . [place] Medications    Prior to Admission medications   Medication Sig Start Date End Date Taking? Authorizing Provider  azithromycin (ZITHROMAX Z-PAK) 250 MG tablet Take 2 tabs today; then begin one tab once daily for 4 more days. (Rx void after 05/09/18) 05/01/18   Kandra Nicolas, MD  cetirizine (ZYRTEC) 10 MG tablet Take 10 mg by mouth as needed.  10/05/14 10/05/15  [provider]  cyclobenzaprine (FLEXERIL) 10 MG tablet Take 0.5-1 tablets (5-10 mg total) by mouth at bedtime as needed for muscle spasms. 05/31/17   Hali Marry, MD  DEXILANT 60 MG capsule Take 60 mg by mouth daily. 03/06/17   [provider]  losartan (COZAAR) 100 MG tablet Take 1 tablet (100 mg total) by mouth daily. LAST REFILL.PLEASE SCHEDULE AN APPOINTMENT 03/29/18   Hali Marry,  MD  meloxicam (MOBIC) 15 MG tablet ONE TAB IN AM WITH BREAKFAST FOR 2 WEEKS, THEN DAILY AS NEEDED FOR PAIN. 03/05/18   Hali Marry, MD  metroNIDAZOLE (METROGEL) 0.75 % gel APPLY 1 APPLICATION TOPICALLY DAILY. 12/09/16   Hali Marry, MD    Family History Family History  Problem Relation Age of Onset  . Hyperlipidemia Father   . Bipolar disorder Brother   . Asthma Mother     Social History Social History   Tobacco Use  . Smoking status: Never Smoker  . Smokeless tobacco: Never Used  Substance Use Topics  . Alcohol use: Yes    Alcohol/week: 1.8 oz    Types: 3 Cans of beer per week  . Drug use: No     Allergies   Iodine and Lisinopril   Review of Systems Review of Systems + sore throat No cough No pleuritic pain No wheezing No nasal congestion ? post-nasal drainage No sinus pain/pressure No itchy/red eyes No earache No hemoptysis No SOB No fever, + chills No nausea No vomiting No abdominal pain No diarrhea No urinary symptoms No skin rash + fatigue + myalgias No headache Used OTC meds without relief   Physical Exam Triage Vital Signs ED Triage Vitals  Enc Vitals Group     BP 05/01/18 1618 (!) 176/116     Pulse Rate 05/01/18 1618 82     Resp --  Temp 05/01/18 1618 98.2 F (36.8 C)     Temp Source 05/01/18 1618 Oral     SpO2 05/01/18 1618 99 %     Weight 05/01/18 1619 196 lb (88.9 kg)     Height 05/01/18 1619 5\' 11"  (1.803 m)     Head Circumference --      Peak Flow --      Pain Score 05/01/18 1619 5     Pain Loc --      Pain Edu? --      Excl. in [place]? --    No data found.  Updated Vital Signs BP (!) 176/116 (BP Location: Right Arm)   Pulse 82   Temp 98.2 F (36.8 C) (Oral)   Ht 5\' 11"  (1.803 m)   Wt 196 lb (88.9 kg)   SpO2 99%   BMI 27.34 kg/m   Visual Acuity Right Eye Distance:   Left Eye Distance:   Bilateral Distance:    Right Eye Near:   Left Eye Near:    Bilateral Near:     Physical Exam Nursing  notes and Vital Signs reviewed. Appearance:  Patient appears stated age, and in no acute distress Eyes:  Pupils are equal, round, and reactive to light and accomodation.  Extraocular movement is intact.  Conjunctivae are not inflamed  Ears:  Canals normal.  Tympanic membranes normal.  Nose:  Mildly congested turbinates.  No sinus tenderness.   Pharynx:   Minimal erythema Neck:  Supple.  Enlarged posterior/lateral nodes are palpated bilaterally, tender to palpation on the left.   Lungs:  Clear to auscultation.  Breath sounds are equal.  Moving air well. Heart:  Regular rate and rhythm without murmurs, rubs, or gallops.  Abdomen:  Nontender without masses or hepatosplenomegaly.  Bowel sounds are present.  No CVA or flank tenderness.  Extremities:  No edema.  Skin:  No rash present.    UC Treatments / Results  Labs (all labs ordered are listed, but only abnormal results are displayed) Labs Reviewed  STREP A DNA PROBE  POCT RAPID STREP A (OFFICE) negative    EKG None  Radiology No results found.  Procedures Procedures (including critical care time)  Medications Ordered in UC Medications - No data to display  Initial Impression / Assessment and Plan / UC Course  I have reviewed the triage vital signs and the nursing notes.  Pertinent labs & imaging results that were available during my care of the patient were reviewed by me and considered in my medical decision making (see chart for details).    There is no evidence of bacterial infection today.  Suspect early viral URI Throat culture pending. Followup with Family Doctor if not improved in about 10 days.   Final Clinical Impressions(s) / UC Diagnoses   Final diagnoses:  Sore throat  Pharyngitis, unspecified etiology     Discharge Instructions     Take plain guaifenesin (1200mg  extended release tabs such as Mucinex) twice daily, with plenty of water, for cough and congestion.  Get adequate rest.   May use Afrin nasal  spray (or generic oxymetazoline) each morning for about 5 days and then discontinue.  Also recommend using saline nasal spray several times daily and saline nasal irrigation (AYR is a common brand).  Use Flonase nasal spray each morning after using Afrin nasal spray and saline nasal irrigation. Try warm salt water gargles for sore throat.  Stop all antihistamines for now, and other non-prescription cough/cold preparations. May take Ibuprofen 200mg ,  4 tabs every 8 hours with food for sore throat, body aches, headache, etc. May take Delsym Cough Suppressant at bedtime for nighttime cough.  Begin Azithromycin if not improving about one week or if persistent fever develops   (Given a prescription to hold, with an expiration date)       ED Prescriptions    Medication Sig Dispense Auth. Provider   azithromycin (ZITHROMAX Z-PAK) 250 MG tablet Take 2 tabs today; then begin one tab once daily for 4 more days. (Rx void after 05/09/18) 6 tablet Kandra Nicolas, MD        Kandra Nicolas, MD 05/02/18 [phone]

## 2018-05-02 ENCOUNTER — Telehealth: Payer: Self-pay | Admitting: *Deleted

## 2018-05-02 LAB — STREP A DNA PROBE: Group A Strep Probe: NOT DETECTED

## 2018-05-02 NOTE — Telephone Encounter (Signed)
Callback: left message on patient's perspnal mobile VM, tcx negative. Call back as needed.

## 2018-06-25 ENCOUNTER — Other Ambulatory Visit: Payer: Self-pay | Admitting: Family Medicine

## 2018-07-31 ENCOUNTER — Other Ambulatory Visit: Payer: Self-pay | Admitting: Family Medicine

## 2018-08-08 ENCOUNTER — Telehealth: Payer: Self-pay

## 2018-08-08 MED ORDER — LOSARTAN POTASSIUM 100 MG PO TABS: 100.0000 mg | ORAL_TABLET | Freq: Every day | ORAL | 0 refills | Status: DC

## 2018-08-08 NOTE — Telephone Encounter (Signed)
Not been seen since July 2018.  Scheduled pt for appt on Friday, will call in short supply for pt.   Advised he HAS to keep upcoming appt.

## 2018-08-10 ENCOUNTER — Encounter: Payer: Self-pay | Admitting: Family Medicine

## 2018-08-10 ENCOUNTER — Ambulatory Visit: Payer: BLUE CROSS/BLUE SHIELD | Admitting: Family Medicine

## 2018-08-10 VITALS — BP 128/74 | HR 91 | Ht 70.0 in | Wt 193.0 lb

## 2018-08-10 DIAGNOSIS — Z8042 Family history of malignant neoplasm of prostate: Secondary | ICD-10-CM | POA: Diagnosis not present

## 2018-08-10 DIAGNOSIS — Z8379 Family history of other diseases of the digestive system: Secondary | ICD-10-CM

## 2018-08-10 DIAGNOSIS — I10 Essential (primary) hypertension: Secondary | ICD-10-CM

## 2018-08-10 DIAGNOSIS — R05 Cough: Secondary | ICD-10-CM

## 2018-08-10 DIAGNOSIS — R053 Chronic cough: Secondary | ICD-10-CM

## 2018-08-10 MED ORDER — METOPROLOL SUCCINATE ER 25 MG PO TB24: 25.0000 mg | ORAL_TABLET | Freq: Every day | ORAL | 2 refills | Status: DC

## 2018-08-10 NOTE — Progress Notes (Signed)
Subjective:    CC: BP  HPI:  Hypertension- Pt denies chest pain, SOB, dizziness, or heart palpitations.  Taking meds as directed w/o problems.  Denies medication side effects.  pt reports that he had labs done @ work and will send results via mychart, he would like to discuss d/c BP medication he reports that it and all of the BP medications that he has ever taken has caused him to cough, also he said that his mother was recently told she has non alcoholic fatty liver dz he wondered if this is something he should be concerned about.  He also wanted to let me know his mother was recently diagnosed with cirrhosis at age 10.  His maternal grandmother actually died from cirrhosis at age 26.  Neither 1 of them died from alcoholic cirrhosis they are not sure exactly why they have it.  He himself has had a history of fatty liver before.  He will continues to have chronic cough and feels like it could be coming from the losartan.  He takes his reflux medicine which is over-the-counter Prilosec daily and it does make a big difference in his GERD symptoms.  He did see ENT at one point a couple years ago for his reflux symptoms.  Did want to let me know that he now works for the [employer] and really enjoys his job.  He says is much less stressful than his previous job.  BP 128/74   Pulse 91   Ht 5\' 10"  (1.778 m)   Wt 193 lb (87.5 kg)   SpO2 99%   BMI 27.69 kg/m     Allergies  Allergen Reactions  . Iodine     Had a reaction to IV dye with skin rash  . Losartan Cough    Cough  . Lisinopril     REACTION: cough Other reaction(s): Cough REACTION: cough    Past Medical History:  Diagnosis Date  . GERD (gastroesophageal reflux disease)   . GERD (gastroesophageal reflux disease)   . Hypertension   . Pityriasis rosea     Past Surgical History:  Procedure Laterality Date  . SHOULDER SURGERY  1998   Left, repair of labrum  . UMBILICAL HERNIA REPAIR  1996    Social History    Socioeconomic History  . Marital status: Married    Spouse name: Stacie  . Number of children: 1  . Years of education: Not on file  . Highest education level: Not on file  Occupational History  . Occupation: [employer]  . Financial resource strain: Not on file  . Food insecurity:    Worry: Not on file    Inability: Not on file  . Transportation needs:    Medical: Not on file    Non-medical: Not on file  Tobacco Use  . Smoking status: Never Smoker  . Smokeless tobacco: Never Used  Substance and Sexual Activity  . Alcohol use: Yes    Alcohol/week: 3.0 standard drinks    Types: 3 Cans of beer per week  . Drug use: No  . Sexual activity: Yes    Partners: Female  Lifestyle  . Physical activity:    Days per week: Not on file    Minutes per session: Not on file  . Stress: Not on file  Relationships  . Social connections:    Talks on phone: Not on file    Gets together: Not on file    Attends religious service: Not  on file    Active member of club or organization: Not on file    Attends meetings of clubs or organizations: Not on file    Relationship status: Not on file  . Intimate partner violence:    Fear of current or ex partner: Not on file    Emotionally abused: Not on file    Physically abused: Not on file    Forced sexual activity: Not on file  Other Topics Concern  . Not on file  Social History Narrative   Occ exercise. Works for [employer].     Family History  Problem Relation Age of Onset  . Hyperlipidemia Father   . Prostate cancer Father   . Bipolar disorder Brother   . Asthma Mother   . Cirrhosis Mother   . Cirrhosis Maternal Grandmother     Outpatient Encounter Medications as of 08/10/2018  Medication Sig  . [DISCONTINUED] losartan (COZAAR) 100 MG tablet Take 1 tablet (100 mg total) by mouth daily. Must keep appt  . metoprolol succinate (TOPROL-XL) 25 MG 24 hr tablet Take 1 tablet (25 mg total) by mouth daily.   . [DISCONTINUED] azithromycin (ZITHROMAX Z-PAK) 250 MG tablet Take 2 tabs today; then begin one tab once daily for 4 more days. (Rx void after 05/09/18)  . [DISCONTINUED] cetirizine (ZYRTEC) 10 MG tablet Take 10 mg by mouth as needed.   . [DISCONTINUED] cyclobenzaprine (FLEXERIL) 10 MG tablet Take 0.5-1 tablets (5-10 mg total) by mouth at bedtime as needed for muscle spasms.  . [DISCONTINUED] DEXILANT 60 MG capsule Take 60 mg by mouth daily.  . [DISCONTINUED] meloxicam (MOBIC) 15 MG tablet ONE TAB IN AM WITH BREAKFAST FOR 2 WEEKS, THEN DAILY AS NEEDED FOR PAIN.  . [DISCONTINUED] metroNIDAZOLE (METROGEL) 0.75 % gel APPLY 1 APPLICATION TOPICALLY DAILY.   No facility-administered encounter medications on file as of 08/10/2018.       Review of Systems: No fevers, chills, night sweats, weight loss, chest pain, or shortness of breath.   Objective:    General: Well Developed, well nourished, and in no acute distress.  Neuro: Alert and oriented x3, extra-ocular muscles intact, sensation grossly intact.  HEENT: Normocephalic, atraumatic, oropharynx is clear. Skin: Warm and dry, no rashes. Cardiac: Regular rate and rhythm, no murmurs rubs or gallops, no lower extremity edema.  Respiratory: Clear to auscultation bilaterally. Not using accessory muscles, speaking in full sentences.   Impression and Recommendations:    HTN -he has been out of his medication for 4 days so his blood pressure is a little bit elevated today.  We discussed switching to a beta-blocker and taking him completely off of an arm to see if that may be the cause of his chronic cough.  Follow-up for nurse visit in 2 weeks to recheck pressure on new medication.  With  Chronic cough-discontinue are then we will switch him to a beta-blocker.  If the cough does not resolve over the next month and please let us know and can refer him to pulmonary for further work-up for chronic cough.  He is already on a chronic PPI.  Declines flu  vaccine today.  Family history of cirrhosis-encouraged him to have his liver enzymes checked regularly, eat a healthy diet, maintain a good healthy weight and workout regularly to minimize any fatty liver.

## 2018-08-14 DIAGNOSIS — I10 Essential (primary) hypertension: Secondary | ICD-10-CM | POA: Diagnosis not present

## 2018-08-14 LAB — COMPLETE METABOLIC PANEL WITH GFR
AG RATIO: 2.1 (calc) (ref 1.0–2.5)
ALBUMIN MSPROF: 4.7 g/dL (ref 3.6–5.1)
ALKALINE PHOSPHATASE (APISO): 53 U/L (ref 40–115)
ALT: 28 U/L (ref 9–46)
AST: 21 U/L (ref 10–40)
BILIRUBIN TOTAL: 0.8 mg/dL (ref 0.2–1.2)
BUN: 14 mg/dL (ref 7–25)
CHLORIDE: 100 mmol/L (ref 98–110)
CO2: 27 mmol/L (ref 20–32)
Calcium: 9.7 mg/dL (ref 8.6–10.3)
Creat: 1.3 mg/dL (ref 0.60–1.35)
GFR, Est African American: 77 mL/min/{1.73_m2} (ref >=60)
GFR, Est Non African American: 67 mL/min/{1.73_m2} (ref >=60)
GLOBULIN: 2.2 g/dL (ref 1.9–3.7)
Glucose, Bld: 90 mg/dL (ref 65–99)
POTASSIUM: 4.3 mmol/L (ref 3.5–5.3)
SODIUM: 137 mmol/L (ref 135–146)
Total Protein: 6.9 g/dL (ref 6.1–8.1)

## 2018-08-14 LAB — LIPID PANEL
CHOL/HDL RATIO: 5.3 (calc) — AB (ref <5.0)
CHOLESTEROL: 208 mg/dL — AB (ref <200)
HDL: 39 mg/dL — AB (ref >40)
LDL Cholesterol (Calc): 124 mg/dL (calc) — ABNORMAL HIGH
Non-HDL Cholesterol (Calc): 169 mg/dL (calc) — ABNORMAL HIGH (ref <130)
Triglycerides: 314 mg/dL — ABNORMAL HIGH (ref <150)

## 2018-08-24 ENCOUNTER — Ambulatory Visit (INDEPENDENT_AMBULATORY_CARE_PROVIDER_SITE_OTHER): Payer: BLUE CROSS/BLUE SHIELD | Admitting: Family Medicine

## 2018-08-24 VITALS — BP 131/81 | HR 70 | Wt 194.0 lb

## 2018-08-24 DIAGNOSIS — I10 Essential (primary) hypertension: Secondary | ICD-10-CM

## 2018-08-24 NOTE — Progress Notes (Signed)
   Subjective:    Patient ID: Robert Morris, male    DOB: [birth year], 42 y.o.   MRN: [id number]  HPI  Robert Morris is here for blood pressure check. He did stop the Losartan and started the Metoprolol. Denies chest pain, shortness of breath or headaches.   Review of Systems     Objective:   Physical Exam        Assessment & Plan:  Hypertension- Blood pressure within normal limits. Patient advised to continue current medication. Follow up as directed.

## 2018-08-24 NOTE — Progress Notes (Signed)
Agree with documentation as above. F/U in 6 months for BP with PCP  Beatrice Lecher, MD

## 2018-08-29 NOTE — Progress Notes (Signed)
Left message advising of recommendations.  

## 2018-09-26 ENCOUNTER — Telehealth: Payer: BLUE CROSS/BLUE SHIELD | Admitting: Nurse Practitioner

## 2018-09-26 DIAGNOSIS — R05 Cough: Secondary | ICD-10-CM

## 2018-09-26 DIAGNOSIS — R059 Cough, unspecified: Secondary | ICD-10-CM

## 2018-09-26 DIAGNOSIS — J029 Acute pharyngitis, unspecified: Secondary | ICD-10-CM

## 2018-09-26 MED ORDER — AMOXICILLIN-POT CLAVULANATE 875-125 MG PO TABS: 1.0000 | ORAL_TABLET | Freq: Two times a day (BID) | ORAL | 0 refills | Status: DC

## 2018-09-26 NOTE — Progress Notes (Signed)
We are sorry that you are not feeling well.  Here is how we plan to help!  Based on what you have shared with me it is likely that you have strep pharyngitis.  Strep pharyngitis is inflammation and infection in the back of the throat.  This is an infection cause by bacteria and is treated with antibiotics.  I have prescribed Augmentin 875mg /125mg  one tablet twice daily with food, for 7 days..  For throat pain, we recommend over the counter oral pain relief medications such as acetaminophen or aspirin, or anti-inflammatory medications such as ibuprofen or naproxen sodium. Topical treatments such as oral throat lozenges or sprays may be used as needed. Strep infections are not as easily transmitted as other respiratory infections, however we still recommend that you avoid close contact with loved ones, especially the very young and elderly.  Remember to wash your hands thoroughly throughout the day as this is the number one way to prevent the spread of infection and wipe down door knobs and counters with disinfectant.   Home Care:  Only take medications as instructed by your medical team.  Complete the entire course of an antibiotic.  Do not take these medications with alcohol.  A steam or ultrasonic humidifier can help congestion.  You can place a towel over your head and breathe in the steam from hot water coming from a faucet.  Avoid close contacts especially the very young and the elderly.  Cover your mouth when you cough or sneeze.  Always remember to wash your hands.  Get Help Right Away If:  You develop worsening fever or sinus pain.  You develop a severe head ache or visual changes.  Your symptoms persist after you have completed your treatment plan.  Make sure you  Understand these instructions.  Will watch your condition.  Will get help right away if you are not doing well or get worse.  Your e-visit answers were reviewed by a board certified advanced clinical  practitioner to complete your personal care plan.  Depending on the condition, your plan could have included both over the counter or prescription medications.  If there is a problem please reply  once you have received a response from your provider.  Your safety is important to [place].  If you have drug allergies check your prescription carefully.    You can use MyChart to ask questions about today's visit, request a non-urgent call back, or ask for a work or school excuse for 24 hours related to this e-Visit. If it has been greater than 24 hours you will need to follow up with your provider, or enter a new e-Visit to address those concerns.  You will get an e-mail in the next two days asking about your experience.  I hope that your e-visit has been valuable and will speed your recovery. Thank you for using e-visits.

## 2018-10-09 ENCOUNTER — Ambulatory Visit (INDEPENDENT_AMBULATORY_CARE_PROVIDER_SITE_OTHER): Payer: BLUE CROSS/BLUE SHIELD | Admitting: Family Medicine

## 2018-10-09 ENCOUNTER — Encounter: Payer: Self-pay | Admitting: Family Medicine

## 2018-10-09 VITALS — BP 138/89 | HR 78 | Ht 70.0 in | Wt 192.0 lb

## 2018-10-09 DIAGNOSIS — J329 Chronic sinusitis, unspecified: Secondary | ICD-10-CM

## 2018-10-09 DIAGNOSIS — J40 Bronchitis, not specified as acute or chronic: Secondary | ICD-10-CM

## 2018-10-09 MED ORDER — AZITHROMYCIN 250 MG PO TABS: ORAL_TABLET | ORAL | 0 refills | Status: AC

## 2018-10-09 MED ORDER — PREDNISONE 20 MG PO TABS: 40.0000 mg | ORAL_TABLET | Freq: Every day | ORAL | 0 refills | Status: DC

## 2018-10-09 MED ORDER — BENZONATATE 200 MG PO CAPS: 200.0000 mg | ORAL_CAPSULE | Freq: Two times a day (BID) | ORAL | 0 refills | Status: DC | PRN

## 2018-10-09 NOTE — Progress Notes (Signed)
   Subjective:    Patient ID: Robert Morris, male    DOB: [birth year], 42 y.o.   MRN: [id number]  HPI  42 yo male is here today for cough.   He did an ED visit on November 13 and was given a prescription for Augmentin. No Fever, sweats or chills. Using tylenol and cough drops.  GEtting yellow and green sputum.  Some facial pressure. Mild ST from the cough.  Had a lot of nasal drainge this morning.    Review of Systems     Objective:   Physical Exam  Constitutional: He is oriented to person, place, and time. He appears well-developed and well-nourished.  HENT:  Head: Normocephalic and atraumatic.  Right Ear: External ear normal.  Left Ear: External ear normal.  Nose: Nose normal.  Mouth/Throat: Oropharynx is clear and moist.  TMs and canals are clear.   Eyes: Pupils are equal, round, and reactive to light. Conjunctivae and EOM are normal.  Neck: Neck supple. No thyromegaly present.  Cardiovascular: Normal rate and normal heart sounds.  Pulmonary/Chest: Effort normal and breath sounds normal.  Lymphadenopathy:    He has no cervical adenopathy.  Neurological: He is alert and oriented to person, place, and time.  Skin: Skin is warm and dry.  Psychiatric: He has a normal mood and affect.        Assessment & Plan:  Acute sinobronchitis - will tx with zpack, tessalon perls and can take the prednisone if not better in a couple of days.  Call fi not improving.  Continue the omeprazole 20mg  as well.

## 2018-12-01 ENCOUNTER — Other Ambulatory Visit: Payer: Self-pay | Admitting: Family Medicine

## 2018-12-01 DIAGNOSIS — I10 Essential (primary) hypertension: Secondary | ICD-10-CM

## 2019-03-08 ENCOUNTER — Other Ambulatory Visit: Payer: Self-pay | Admitting: Family Medicine

## 2019-03-08 DIAGNOSIS — I10 Essential (primary) hypertension: Secondary | ICD-10-CM

## 2019-03-08 NOTE — Telephone Encounter (Signed)
Left VM for Pt to return clinic call for scheduling.

## 2019-03-08 NOTE — Telephone Encounter (Signed)
Please schedule for 6 mo f/u for BP virtual.  Has actually been 7 mo since regular OV.

## 2019-03-11 ENCOUNTER — Other Ambulatory Visit: Payer: Self-pay | Admitting: Family Medicine

## 2019-03-11 DIAGNOSIS — I10 Essential (primary) hypertension: Secondary | ICD-10-CM

## 2019-03-11 NOTE — Telephone Encounter (Signed)
Patient scheduled.

## 2019-03-12 ENCOUNTER — Ambulatory Visit (INDEPENDENT_AMBULATORY_CARE_PROVIDER_SITE_OTHER): Payer: BLUE CROSS/BLUE SHIELD | Admitting: Family Medicine

## 2019-03-12 ENCOUNTER — Encounter: Payer: Self-pay | Admitting: Family Medicine

## 2019-03-12 VITALS — BP 118/85 | HR 73 | Ht 70.0 in | Wt 190.0 lb

## 2019-03-12 DIAGNOSIS — I10 Essential (primary) hypertension: Secondary | ICD-10-CM

## 2019-03-12 DIAGNOSIS — M545 Low back pain, unspecified: Secondary | ICD-10-CM

## 2019-03-12 DIAGNOSIS — K2100 Gastro-esophageal reflux disease with esophagitis, without bleeding: Secondary | ICD-10-CM

## 2019-03-12 MED ORDER — METOPROLOL SUCCINATE ER 25 MG PO TB24: 25.0000 mg | ORAL_TABLET | Freq: Every day | ORAL | 1 refills | Status: DC

## 2019-03-12 MED ORDER — FAMOTIDINE 20 MG PO TABS: 20.0000 mg | ORAL_TABLET | Freq: Two times a day (BID) | ORAL | 3 refills | Status: DC

## 2019-03-12 MED ORDER — OMEPRAZOLE 20 MG PO CPDR: 20.0000 mg | DELAYED_RELEASE_CAPSULE | Freq: Every day | ORAL | 0 refills | Status: DC

## 2019-03-12 NOTE — Progress Notes (Signed)
Pt has no problems w/medications. He would like prescription for omeprazole written. And 90 day instead of 30 day.Elouise Munroe, [place]

## 2019-03-12 NOTE — Progress Notes (Signed)
Virtual Visit via Video Note  I connected with Robert Morris on 03/12/19 at  8:50 AM EDT by a video enabled telemedicine application and verified that I am speaking with the correct person using two identifiers.   I discussed the limitations of evaluation and management by telemedicine and the availability of in person appointments. The patient expressed understanding and agreed to proceed.  Subjective:    CC: F/u BP  HPI:  Hypertension- Pt denies chest pain, SOB, dizziness, or heart palpitations.  Taking meds as directed w/o problems.  Denies medication side effects.  Has been     F/U GERD - Takes his PPI daily  Says even if he misses a dose he gets back reflux.  Has some triggers that he has figured out.  Says it can keep him awake at night.    Low back pain - worse when he sit down after he has been working. Itis painful and stiff at times.  Says feels like something isn't line up. Has had sciatica befor but not recently. No radiation of pain. Says hard to get comfortable.  No meds for it    Past medical history, Surgical history, Family history not pertinant except as noted below, Social history, Allergies, and medications have been entered into the medical record, reviewed, and corrections made.   Review of Systems: No fevers, chills, night sweats, weight loss, chest pain, or shortness of breath.   Objective:    General: Speaking clearly in complete sentences without any shortness of breath.  Alert and oriented x3.  Normal judgment. No apparent acute distress. Well groomed.    Impression and Recommendations:     HTN - Well controlled. Continue current regimen. Follow up in  6 months.    GERD - discussed long term risks of PPIs with chronic use.  Says wondering if could be contributing to some of his joint pain. Has beenon PPI for abut 15-20 years. Will try famotidine. Try to use the PPI less frequently.  Try to avoid greasy, spcie foods.      Low back pain - work on core  strengthening.  Ok to see a Restaurant manager, fast food. Consider xray in the future. Ok to NSAID>    I discussed the assessment and treatment plan with the patient. The patient was provided an opportunity to ask questions and all were answered. The patient agreed with the plan and demonstrated an understanding of the instructions.   The patient was advised to call back or seek an in-person evaluation if the symptoms worsen or if the condition fails to improve as anticipated.   Beatrice Lecher, MD

## 2019-08-06 DIAGNOSIS — Z23 Encounter for immunization: Secondary | ICD-10-CM | POA: Diagnosis not present

## 2019-08-15 ENCOUNTER — Other Ambulatory Visit: Payer: Self-pay | Admitting: Family Medicine

## 2019-09-16 ENCOUNTER — Other Ambulatory Visit: Payer: Self-pay | Admitting: Family Medicine

## 2019-09-16 DIAGNOSIS — I10 Essential (primary) hypertension: Secondary | ICD-10-CM

## 2019-10-02 ENCOUNTER — Telehealth (INDEPENDENT_AMBULATORY_CARE_PROVIDER_SITE_OTHER): Payer: BC Managed Care – PPO | Admitting: Family Medicine

## 2019-10-02 ENCOUNTER — Encounter: Payer: Self-pay | Admitting: Family Medicine

## 2019-10-02 DIAGNOSIS — E781 Pure hyperglyceridemia: Secondary | ICD-10-CM

## 2019-10-02 DIAGNOSIS — R0683 Snoring: Secondary | ICD-10-CM | POA: Diagnosis not present

## 2019-10-02 DIAGNOSIS — K2100 Gastro-esophageal reflux disease with esophagitis, without bleeding: Secondary | ICD-10-CM

## 2019-10-02 DIAGNOSIS — I10 Essential (primary) hypertension: Secondary | ICD-10-CM

## 2019-10-02 MED ORDER — AMLODIPINE BESYLATE 5 MG PO TABS: 5.0000 mg | ORAL_TABLET | Freq: Every day | ORAL | 3 refills | Status: DC

## 2019-10-02 MED ORDER — METOPROLOL SUCCINATE ER 25 MG PO TB24: 25.0000 mg | ORAL_TABLET | Freq: Every day | ORAL | 1 refills | Status: DC

## 2019-10-02 NOTE — Progress Notes (Signed)
Virtual Visit via Video Note  I connected with Robert Morris on 10/02/19 at  8:50 AM EST by a video enabled telemedicine application and verified that I am speaking with the correct person using two identifiers.   I discussed the limitations of evaluation and management by telemedicine and the availability of in person appointments. The patient expressed understanding and agreed to proceed.    Established Patient Office Visit  Subjective:  Patient ID: Robert Morris, male    DOB: 1976-03-12  Age: 43 y.o. MRN: [id number]  CC:  Chief Complaint  Patient presents with  . Hypertension    HPI Robert Morris presents for   Hypertension- Pt denies chest pain, SOB, dizziness, or heart palpitations.  Taking meds as directed w/o problems.  Denies medication side effects.  He reports that home blood pressures occasionally will show diastolic pressures elevated in the upper 80s.  He has also noticed that he has been having some headaches in the mornings usually waking up with them maybe once every couple of weeks.  Usually he will take an Aleve and that seems to help.  He does snore.  No family history of sleep apnea.    He also went to discuss his reflux.  He says that even on the Pepcid he still will sometimes get some breakthrough symptoms.  He says he was on the Prilosec for a long time but feels like the Pepcid actually helps him a little bit more.  He says if he takes it very consistently twice a day it makes a big difference but if he forgets a dose the heartburn can be so severe that he cannot sleep.  He really feels like he does a good job with his diet and avoiding trigger foods.  He still getting some chest pain and discomfort.  He has had endoscopy in the last 10 years and says he was told at one point that he had a hiatal hernia as well as a little bit of gastritis.  He does still drink 1 cup of coffee in the morning.  Past Medical History:  Diagnosis Date  . GERD  (gastroesophageal reflux disease)   . GERD (gastroesophageal reflux disease)   . Hypertension   . Pityriasis rosea     Past Surgical History:  Procedure Laterality Date  . SHOULDER SURGERY  1998   Left, repair of labrum  . UMBILICAL HERNIA REPAIR  1996    Family History  Problem Relation Age of Onset  . Hyperlipidemia Father   . Prostate cancer Father   . Bipolar disorder Brother   . Asthma Mother   . Cirrhosis Mother   . Cirrhosis Maternal Grandmother     Social History   Socioeconomic History  . Marital status: Married    Spouse name: Stacie  . Number of children: 1  . Years of education: Not on file  . Highest education level: Not on file  Occupational History  . Occupation: [employer]  . Financial resource strain: Not on file  . Food insecurity    Worry: Not on file    Inability: Not on file  . Transportation needs    Medical: Not on file    Non-medical: Not on file  Tobacco Use  . Smoking status: Never Smoker  . Smokeless tobacco: Never Used  Substance and Sexual Activity  . Alcohol use: Yes    Alcohol/week: 3.0 standard drinks    Types: 3 Cans of beer per week  .  Drug use: No  . Sexual activity: Yes    Partners: Female  Lifestyle  . Physical activity    Days per week: Not on file    Minutes per session: Not on file  . Stress: Not on file  Relationships  . Social Herbalist on phone: Not on file    Gets together: Not on file    Attends religious service: Not on file    Active member of club or organization: Not on file    Attends meetings of clubs or organizations: Not on file    Relationship status: Not on file  . Intimate partner violence    Fear of current or ex partner: Not on file    Emotionally abused: Not on file    Physically abused: Not on file    Forced sexual activity: Not on file  Other Topics Concern  . Not on file  Social History Narrative   Occ exercise. Works for [employer].      Outpatient Medications Prior to Visit  Medication Sig Dispense Refill  . famotidine (PEPCID) 20 MG tablet Take 1 tablet (20 mg total) by mouth 2 (two) times daily. 180 tablet 3  . metoprolol succinate (TOPROL-XL) 25 MG 24 hr tablet Take 1 tablet (25 mg total) by mouth daily. APPOINTMENT AND LABS REQUIRED FOR ADDITIONAL REFILLS 90 tablet 0  . omeprazole (PRILOSEC) 20 MG capsule TAKE 1 CAPSULE BY MOUTH EVERY DAY 90 capsule 3   No facility-administered medications prior to visit.     Allergies  Allergen Reactions  . Iodine     Had a reaction to IV dye with skin rash  . Losartan Cough    Cough  . Lisinopril     REACTION: cough Other reaction(s): Cough REACTION: cough    ROS Review of Systems    Objective:    Physical Exam  Constitutional: He is oriented to person, place, and time. He appears well-developed and well-nourished.  HENT:  Head: Normocephalic and atraumatic.  Eyes: Conjunctivae and EOM are normal.  Pulmonary/Chest: Effort normal.  Neurological: He is alert and oriented to person, place, and time.  Skin: Skin is dry. No pallor.  Psychiatric: He has a normal mood and affect. His behavior is normal.  Vitals reviewed.   There were no vitals taken for this visit. Wt Readings from Last 3 Encounters:  03/12/19 190 lb (86.2 kg)  10/09/18 192 lb (87.1 kg)  08/24/18 194 lb (88 kg)     Health Maintenance Due  Topic Date Due  . HIV Screening  09/30/1989    There are no preventive care reminders to display for this patient.  Lab Results  Component Value Date   TSH 6.23 (H) 03/08/2016   Lab Results  Component Value Date   WBC 6.6 03/08/2016   HGB 15.7 03/08/2016   HCT 45.0 03/08/2016   MCV 85.6 03/08/2016   PLT 205 03/08/2016   Lab Results  Component Value Date   NA 137 08/14/2018   K 4.3 08/14/2018   CO2 27 08/14/2018   GLUCOSE 90 08/14/2018   BUN 14 08/14/2018   CREATININE 1.30 08/14/2018   BILITOT 0.8 08/14/2018   ALKPHOS 53 08/30/2016    AST 21 08/14/2018   ALT 28 08/14/2018   PROT 6.9 08/14/2018   ALBUMIN 4.4 08/30/2016   CALCIUM 9.7 08/14/2018   Lab Results  Component Value Date   CHOL 208 (H) 08/14/2018   Lab Results  Component Value Date  HDL 39 (L) 08/14/2018   Lab Results  Component Value Date   LDLCALC 124 (H) 08/14/2018   Lab Results  Component Value Date   TRIG 314 (H) 08/14/2018   Lab Results  Component Value Date   CHOLHDL 5.3 (H) 08/14/2018   No results found for: HGBA1C    Assessment & Plan:   Problem List Items Addressed This Visit      Cardiovascular and Mediastinum   Essential hypertension - Primary    Blood pressure overall looks good but it sounds like he has been getting some borderline elevated diastolics at home so we can discontinue metoprolol and switch to 5 mg of amlodipine and see if he feels like this gives him more consistent readings on his blood pressure.  Plan to follow-up in about 2 weeks.  Due for up-to-date labs.      Relevant Medications   metoprolol succinate (TOPROL-XL) 25 MG 24 hr tablet   amLODipine (NORVASC) 5 MG tablet   Other Relevant Orders   Lipid Panel w/reflex Direct LDL   COMPLETE METABOLIC PANEL WITH GFR     Digestive   Gastroesophageal reflux disease with esophagitis    Since he is having breakthrough symptoms I would like to refer him to GI for further evaluation.  Just continue to work to work on dietary measures for reflux.        Other   HYPERTRIGLYCERIDEMIA   Relevant Medications   metoprolol succinate (TOPROL-XL) 25 MG 24 hr tablet   amLODipine (NORVASC) 5 MG tablet   Other Relevant Orders   Lipid Panel w/reflex Direct LDL   COMPLETE METABOLIC PANEL WITH GFR    Other Visit Diagnoses    Snoring          Meds ordered this encounter  Medications  . metoprolol succinate (TOPROL-XL) 25 MG 24 hr tablet    Sig: Take 1 tablet (25 mg total) by mouth daily.    Dispense:  90 tablet    Refill:  1    Patient will call when ready to pick  up.  Marland Kitchen amLODipine (NORVASC) 5 MG tablet    Sig: Take 1 tablet (5 mg total) by mouth daily.    Dispense:  30 tablet    Refill:  3   Snoring-I asked him to ask his wife a few more questions about the snoring how often it occurs then if it is loud.  Also encouraged him to measure his neck size only follow-up in 2 weeks we will have him fill out a stop bang.  Especially with waking up with morning headaches and concerned that he could actually have obstructive sleep apnea which could be contributing to his blood pressure readings.  Follow-up: Return in about 2 weeks (around 10/16/2019) for Hypertension.     I discussed the assessment and treatment plan with the patient. The patient was provided an opportunity to ask questions and all were answered. The patient agreed with the plan and demonstrated an understanding of the instructions.   The patient was advised to call back or seek an in-person evaluation if the symptoms worsen or if the condition fails to improve as anticipated.    Beatrice Lecher, MD

## 2019-10-02 NOTE — Assessment & Plan Note (Addendum)
Blood pressure overall looks good but it sounds like he has been getting some borderline elevated diastolics at home so we can discontinue metoprolol and switch to 5 mg of amlodipine and see if he feels like this gives him more consistent readings on his blood pressure.  Plan to follow-up in about 2 weeks.  Due for up-to-date labs.

## 2019-10-02 NOTE — Assessment & Plan Note (Signed)
Since he is having breakthrough symptoms I would like to refer him to GI for further evaluation.  Just continue to work to work on dietary measures for reflux.

## 2019-10-07 DIAGNOSIS — E781 Pure hyperglyceridemia: Secondary | ICD-10-CM | POA: Diagnosis not present

## 2019-10-07 DIAGNOSIS — I10 Essential (primary) hypertension: Secondary | ICD-10-CM | POA: Diagnosis not present

## 2019-10-08 LAB — LIPID PANEL W/REFLEX DIRECT LDL
Cholesterol: 215 mg/dL — ABNORMAL HIGH (ref <200)
HDL: 37 mg/dL — ABNORMAL LOW (ref >=40)
LDL Cholesterol (Calc): 132 mg/dL (calc) — ABNORMAL HIGH
Non-HDL Cholesterol (Calc): 178 mg/dL (calc) — ABNORMAL HIGH (ref <130)
Total CHOL/HDL Ratio: 5.8 (calc) — ABNORMAL HIGH (ref <5.0)
Triglycerides: 324 mg/dL — ABNORMAL HIGH (ref <150)

## 2019-10-08 LAB — COMPLETE METABOLIC PANEL WITH GFR
AG Ratio: 2.1 (calc) (ref 1.0–2.5)
ALT: 30 U/L (ref 9–46)
AST: 22 U/L (ref 10–40)
Albumin: 4.5 g/dL (ref 3.6–5.1)
Alkaline phosphatase (APISO): 49 U/L (ref 36–130)
BUN: 16 mg/dL (ref 7–25)
CO2: 29 mmol/L (ref 20–32)
Calcium: 9.7 mg/dL (ref 8.6–10.3)
Chloride: 105 mmol/L (ref 98–110)
Creat: 1.27 mg/dL (ref 0.60–1.35)
GFR, Est African American: 79 mL/min/{1.73_m2} (ref >=60)
GFR, Est Non African American: 68 mL/min/{1.73_m2} (ref >=60)
Globulin: 2.1 g/dL (calc) (ref 1.9–3.7)
Glucose, Bld: 85 mg/dL (ref 65–99)
Potassium: 4.4 mmol/L (ref 3.5–5.3)
Sodium: 141 mmol/L (ref 135–146)
Total Bilirubin: 0.8 mg/dL (ref 0.2–1.2)
Total Protein: 6.6 g/dL (ref 6.1–8.1)

## 2019-10-21 ENCOUNTER — Ambulatory Visit: Payer: Self-pay | Admitting: Family Medicine

## 2019-10-23 ENCOUNTER — Ambulatory Visit (INDEPENDENT_AMBULATORY_CARE_PROVIDER_SITE_OTHER): Payer: BC Managed Care – PPO | Admitting: Family Medicine

## 2019-10-23 ENCOUNTER — Encounter: Payer: Self-pay | Admitting: Family Medicine

## 2019-10-23 VITALS — BP 126/79 | HR 85 | Ht 70.0 in | Wt 205.0 lb

## 2019-10-23 DIAGNOSIS — R0683 Snoring: Secondary | ICD-10-CM

## 2019-10-23 DIAGNOSIS — I10 Essential (primary) hypertension: Secondary | ICD-10-CM | POA: Diagnosis not present

## 2019-10-23 DIAGNOSIS — R519 Headache, unspecified: Secondary | ICD-10-CM

## 2019-10-23 DIAGNOSIS — K2100 Gastro-esophageal reflux disease with esophagitis, without bleeding: Secondary | ICD-10-CM

## 2019-10-23 DIAGNOSIS — R1013 Epigastric pain: Secondary | ICD-10-CM | POA: Diagnosis not present

## 2019-10-23 NOTE — Assessment & Plan Note (Signed)
Will place referral to GI for further evaluation.

## 2019-10-23 NOTE — Progress Notes (Signed)
Some of his home readings:  133/83 126/79 119/71 117/79 120/72 120/84

## 2019-10-23 NOTE — Progress Notes (Signed)
Established Patient Office Visit  Subjective:  Patient ID: Robert Morris, male    DOB: 11-20-1975  Age: 43 y.o. MRN: [id number]  CC:  Chief Complaint  Patient presents with  . Hypertension    he brought in his home bp cuff readings have been very good at home today in office it was 155/100    HPI Robert Morris presents for follow-up of hypertension.  After a virtual visit in November we decided to discontinue metoprolol and switch to amlodipine as he was having some borderline elevated diastolics.  He felt like metoprolol was not giving him consistent readings.  He did bring in home blood pressure monitor today.  He reports home blood pressures have been running in the upper one- teens to [id number] on the systolic.  And typically in the [id number] for the diastolic.  He has been tolerating the amlodipine well without any side effects or problems and is happy with his current regimen.  We also assessed him for sleep apnea as he had complained of significant snoring and morning headaches.  We discussed getting his wife to measure his neck size so that we can have him complete a stop bang questionnaire.  He also reports that he has a degenerated TMJ joint per his dentist.  They recommended a mouthguard at one point even though he did not have any sign of worn enamel thinking that he might be grinding and that might be causing the excess degeneration of the joint.  Because of the cost he did not get the mold made.  Hyperlipidemia/hypertriglyceridemia-he does take fish oil once a day he did want to go over his results today.  He still having some epigastric left-sided pain that is been going on for about 10 years.  It usually triggered by eating or drinking.  He has had 3 endoscopies over the last 10 years but would like to get back in with GI.  He does take his famotidine and it does seem to help.  He does have does have a history of a hiatal hernia.  Past Medical History:  Diagnosis Date  . GERD  (gastroesophageal reflux disease)   . GERD (gastroesophageal reflux disease)   . Hypertension   . Pityriasis rosea     Past Surgical History:  Procedure Laterality Date  . SHOULDER SURGERY  1998   Left, repair of labrum  . UMBILICAL HERNIA REPAIR  1996    Family History  Problem Relation Age of Onset  . Hyperlipidemia Father   . Prostate cancer Father   . Bipolar disorder Brother   . Asthma Mother   . Cirrhosis Mother   . Cirrhosis Maternal Grandmother     Social History   Socioeconomic History  . Marital status: Married    Spouse name: Stacie  . Number of children: 1  . Years of education: Not on file  . Highest education level: Not on file  Occupational History  . Occupation: [employer]  . Financial resource strain: Not on file  . Food insecurity    Worry: Not on file    Inability: Not on file  . Transportation needs    Medical: Not on file    Non-medical: Not on file  Tobacco Use  . Smoking status: Never Smoker  . Smokeless tobacco: Never Used  Substance and Sexual Activity  . Alcohol use: Yes    Alcohol/week: 3.0 standard drinks    Types: 3 Cans of beer per week  .  Drug use: No  . Sexual activity: Yes    Partners: Female  Lifestyle  . Physical activity    Days per week: Not on file    Minutes per session: Not on file  . Stress: Not on file  Relationships  . Social Herbalist on phone: Not on file    Gets together: Not on file    Attends religious service: Not on file    Active member of club or organization: Not on file    Attends meetings of clubs or organizations: Not on file    Relationship status: Not on file  . Intimate partner violence    Fear of current or ex partner: Not on file    Emotionally abused: Not on file    Physically abused: Not on file    Forced sexual activity: Not on file  Other Topics Concern  . Not on file  Social History Narrative   Occ exercise. Works for [employer].      Outpatient Medications Prior to Visit  Medication Sig Dispense Refill  . amLODipine (NORVASC) 5 MG tablet Take 1 tablet (5 mg total) by mouth daily. 30 tablet 3  . famotidine (PEPCID) 20 MG tablet Take 1 tablet (20 mg total) by mouth 2 (two) times daily. 180 tablet 3  . metoprolol succinate (TOPROL-XL) 25 MG 24 hr tablet Take 1 tablet (25 mg total) by mouth daily. 90 tablet 1   No facility-administered medications prior to visit.     Allergies  Allergen Reactions  . Iodine     Had a reaction to IV dye with skin rash  . Losartan Cough    Cough  . Lisinopril     REACTION: cough Other reaction(s): Cough REACTION: cough    ROS Review of Systems    Objective:    Physical Exam  Constitutional: He is oriented to person, place, and time. He appears well-developed and well-nourished.  HENT:  Head: Normocephalic and atraumatic.  Cardiovascular: Normal rate, regular rhythm and normal heart sounds.  Pulmonary/Chest: Effort normal and breath sounds normal.  Neurological: He is alert and oriented to person, place, and time.  Skin: Skin is warm and dry.  Psychiatric: He has a normal mood and affect. His behavior is normal.    BP 126/79   Pulse 85   Ht 5\' 10"  (1.778 m)   Wt 205 lb (93 kg)   SpO2 100%   BMI 29.41 kg/m  Wt Readings from Last 3 Encounters:  10/23/19 205 lb (93 kg)  03/12/19 190 lb (86.2 kg)  10/09/18 192 lb (87.1 kg)     There are no preventive care reminders to display for this patient.  There are no preventive care reminders to display for this patient.  Lab Results  Component Value Date   TSH 6.23 (H) 03/08/2016   Lab Results  Component Value Date   WBC 6.6 03/08/2016   HGB 15.7 03/08/2016   HCT 45.0 03/08/2016   MCV 85.6 03/08/2016   PLT 205 03/08/2016   Lab Results  Component Value Date   NA 141 10/07/2019   K 4.4 10/07/2019   CO2 29 10/07/2019   GLUCOSE 85 10/07/2019   BUN 16 10/07/2019   CREATININE 1.27 10/07/2019   BILITOT 0.8  10/07/2019   ALKPHOS 53 08/30/2016   AST 22 10/07/2019   ALT 30 10/07/2019   PROT 6.6 10/07/2019   ALBUMIN 4.4 08/30/2016   CALCIUM 9.7 10/07/2019   Lab Results  Component Value Date   CHOL 215 (H) 10/07/2019   Lab Results  Component Value Date   HDL 37 (L) 10/07/2019   Lab Results  Component Value Date   LDLCALC 132 (H) 10/07/2019   Lab Results  Component Value Date   TRIG 324 (H) 10/07/2019   Lab Results  Component Value Date   CHOLHDL 5.8 (H) 10/07/2019   No results found for: HGBA1C    Assessment & Plan:   Problem List Items Addressed This Visit      Cardiovascular and Mediastinum   Essential hypertension - Primary    Well controlled. Continue current regimen. Follow up in  6 months.          Digestive   Gastroesophageal reflux disease with esophagitis    Will place referral to GI for further evaluation.      Relevant Orders   Ambulatory referral to Gastroenterology     Other   Snoring    Stop bang questionnaire score of 4 today which is significant.  Recommend further work-up to evaluate for obstructive sleep apnea.  We will schedule home sleep study.      Relevant Orders   Home sleep test    Other Visit Diagnoses    Epigastric pain       Relevant Orders   Ambulatory referral to Gastroenterology   Nonintractable episodic headache, unspecified headache type         Headaches-unclear etiology they do seem better than the last time I saw him could be related to his blood pressure and they could have improved because his blood pressures improved.  Also consider could be related to his degenerative TMJ joint.  Dentist also suggested that he might even be grinding at night which is also possibility.  He could not afford the custom mouthguard so did recommend over-the-counter mouthguard which can you be found usually at the local pharmacy could certainly give that a try for a month and see if he notices a difference.  No orders of the defined types were  placed in this encounter.   Follow-up: Return in about 6 months (around 04/22/2020) for Hypertension.    Beatrice Lecher, MD

## 2019-10-23 NOTE — Assessment & Plan Note (Signed)
Well controlled. Continue current regimen. Follow up in  6 months.  

## 2019-10-23 NOTE — Assessment & Plan Note (Signed)
Stop bang questionnaire score of 4 today which is significant.  Recommend further work-up to evaluate for obstructive sleep apnea.  We will schedule home sleep study.

## 2019-10-24 ENCOUNTER — Encounter: Payer: Self-pay | Admitting: Gastroenterology

## 2019-11-19 ENCOUNTER — Ambulatory Visit: Payer: BC Managed Care – PPO | Admitting: Gastroenterology

## 2019-11-19 ENCOUNTER — Other Ambulatory Visit: Payer: Self-pay

## 2019-11-19 ENCOUNTER — Encounter: Payer: Self-pay | Admitting: Gastroenterology

## 2019-11-19 VITALS — BP 138/86 | HR 94 | Temp 98.4°F | Ht 71.0 in | Wt 203.0 lb

## 2019-11-19 DIAGNOSIS — Z1159 Encounter for screening for other viral diseases: Secondary | ICD-10-CM

## 2019-11-19 DIAGNOSIS — R1012 Left upper quadrant pain: Secondary | ICD-10-CM

## 2019-11-19 DIAGNOSIS — K580 Irritable bowel syndrome with diarrhea: Secondary | ICD-10-CM

## 2019-11-19 DIAGNOSIS — R1310 Dysphagia, unspecified: Secondary | ICD-10-CM

## 2019-11-19 DIAGNOSIS — K219 Gastro-esophageal reflux disease without esophagitis: Secondary | ICD-10-CM

## 2019-11-19 DIAGNOSIS — R1031 Right lower quadrant pain: Secondary | ICD-10-CM

## 2019-11-19 MED ORDER — DICYCLOMINE HCL 10 MG PO CAPS: 10.0000 mg | ORAL_CAPSULE | Freq: Two times a day (BID) | ORAL | 0 refills | Status: DC

## 2019-11-19 MED ORDER — PREDNISONE 50 MG PO TABS: ORAL_TABLET | ORAL | 0 refills | Status: DC

## 2019-11-19 MED ORDER — OMEPRAZOLE 20 MG PO CPDR: 20.0000 mg | DELAYED_RELEASE_CAPSULE | Freq: Two times a day (BID) | ORAL | 6 refills | Status: DC

## 2019-11-19 NOTE — Progress Notes (Signed)
Chief Complaint: Abdominal problems.  Referring Provider:  Hali Marry, *      ASSESSMENT AND PLAN;   #1. GERD with occ dysphagia.  #2. RLQ/LUQ pain with tenderness.  #3. History s/o IBS with predominant diarrhea. R/O other causes.  Plan: - Increased omeprazole 20mg  Bid, 60, 6 refills. - EGD with dil at [facility].  I have discussed risks and benefits. - CT AP with PO/IV contrast.  Would need contrast desensitization prior.  We will take care of that. - Bentyl 10mg  po bid #60, 2 refills. - Encouraged him to lose weight once better - Colon at later date if still with problems.  Otherwise routine colonoscopy at age 20.   HPI:    Robert Morris is a 44 y.o. male  Very nice patient  -Longstanding history of epi/LUQ pain x 4-5 yrs, getting worse, burning type of pain not relieved by omeprazole.  Associated with occasional nausea.  Nonradiating.  At times getting worse after eating.  No particular foods.  -Has longstanding H/O GERD x 20 yrs.  Lately has been having occasional heartburn, dysphagia mostly to solids x 1 year.  Recently had soft burrito which got hung up in the lower chest.  No odynophagia.  Has associated regurgitation, belching. has been compliant with omeprazole.  Denies having any significant nocturnal symptoms.  Does have chronic cough.  -Also with RLQ abdominal pain, associated with abdominal bloating, nonradiating.  Intermittent diarrhea mostly after eating 2-3 BMs/day.  Diarrhea gets worse when he is under stress.  No nocturnal diarrhea. No melena or hematochezia.  No fever chills or night sweats.  Does feel some tenderness in the right lower quadrant.  No weight loss.  In fact, he has gained weight.  No sodas, chocolates, chewing gums, artificial sweeteners and candy. No NSAIDs.   Allergy test 2-3 yrs done -skin test was negative.  No definite food intolerance.   Previous GI work-up: -EGD 03/2017: Gastric polyps-biopsied (fundic gland polyps). Nl  esophagus. Neg eso Bx for EoE, neg gastric biopsies for HP (Dr Clydene Laming). -[place] 2018 fatty liver, small gallbladder polyp 3-4 mm.  Stable as compared to ultrasound 11/2013. -HIDA scan 2015 was negative.  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  Married.  1 son, 1 daughter. Past Medical History:  Diagnosis Date  . GERD (gastroesophageal reflux disease)   . GERD (gastroesophageal reflux disease)   . Hypertension   . Pityriasis rosea     Past Surgical History:  Procedure Laterality Date  . ESOPHAGOGASTRODUODENOSCOPY  2018  . SHOULDER SURGERY  1998   Left, repair of labrum  . UMBILICAL HERNIA REPAIR  1996    Family History  Problem Relation Age of Onset  . Hyperlipidemia Father   . Prostate cancer Father   . Bipolar disorder Brother   . Asthma Mother   . Cirrhosis Mother   . Cirrhosis Maternal Grandmother   . Colon cancer Neg Hx   . Esophageal cancer Neg Hx     Social History   Tobacco Use  . Smoking status: Never Smoker  . Smokeless tobacco: Never Used  Substance Use Topics  . Alcohol use: Yes    Alcohol/week: 3.0 standard drinks    Types: 3 Cans of beer per week  . Drug use: No    Current Outpatient Medications  Medication Sig Dispense Refill  . amLODipine (NORVASC) 5 MG tablet Take 1 tablet (5 mg total) by mouth daily. 30 tablet 3  . omeprazole (PRILOSEC) 20 MG capsule  Take 20 mg by mouth daily.    . famotidine (PEPCID) 20 MG tablet Take 1 tablet (20 mg total) by mouth 2 (two) times daily. (Patient not taking: Reported on 11/19/2019) 180 tablet 3   No current facility-administered medications for this visit.    Allergies  Allergen Reactions  . Iodine     Had a reaction to IV dye with skin rash  . Losartan Cough    Cough  . Lisinopril     REACTION: cough Other reaction(s): Cough REACTION: cough    Review of Systems:  Constitutional: Denies fever, chills, diaphoresis, appetite change and has fatigue.  HEENT: Denies photophobia, eye pain,  redness, hearing loss, ear pain, congestion, sore throat, rhinorrhea, sneezing, mouth sores, neck pain, neck stiffness and tinnitus.   Respiratory: Denies SOB, DOE,chest tightness,  and wheezing.  Occasional cough. Cardiovascular: Had chest pains.  Negative stress test 3 years ago. Genitourinary: Denies dysuria, urgency, frequency, hematuria, flank pain and difficulty urinating.  Musculoskeletal: Denies myalgias, back pain, joint swelling, arthralgias and gait problem.  Skin: No rash.  Neurological: Denies dizziness, seizures, syncope, weakness, light-headedness, numbness and headaches.  Hematological: Denies adenopathy. Easy bruising, personal or family bleeding history  Psychiatric/Behavioral: Has situational anxiety, no depression.      Physical Exam:    Pulse 94   Temp 98.4 F (36.9 C)   Ht 5\' 11"  (1.803 m)   Wt 203 lb (92.1 kg)   BMI 28.31 kg/m  Wt Readings from Last 3 Encounters:  11/19/19 203 lb (92.1 kg)  10/23/19 205 lb (93 kg)  03/12/19 190 lb (86.2 kg)   Constitutional:  Well-developed, in no acute distress. Psychiatric: Normal mood and affect. Behavior is normal. HEENT: Pupils normal.  Conjunctivae are normal. No scleral icterus. Neck supple.  Cardiovascular: Normal rate, regular rhythm. No edema Pulmonary/chest: Effort normal and breath sounds normal. No wheezing, rales or rhonchi. Abdominal: Soft, nondistended.  Right lower quadrant tenderness.  No rebound.  Bowel sounds active throughout. There are no masses palpable. No hepatomegaly.  Well-healed scar from previous umbilical hernia repair. Rectal:  defered Neurological: Alert and oriented to person place and time. Skin: Skin is warm and dry. No rashes noted.  Data Reviewed: I have personally reviewed following labs and imaging studies  CBC: CBC Latest Ref Rng & Units 03/08/2016 10/16/2015 10/30/2014  WBC 3.8 - 10.8 K/uL 6.6 7.9 8.8  Hemoglobin 13.2 - 17.1 g/dL 15.7 16.2 15.0  Hematocrit 38.5 - 50.0 % 45.0 46.1  43.5  Platelets 140 - 400 K/uL 205 216 210    CMP: CMP Latest Ref Rng & Units 10/07/2019 08/14/2018 08/30/2016  Glucose 65 - 99 mg/dL 85 90 96  BUN 7 - 25 mg/dL 16 14 14   Creatinine 0.60 - 1.35 mg/dL 1.27 1.30 1.20  Sodium 135 - 146 mmol/L 141 137 140  Potassium 3.5 - 5.3 mmol/L 4.4 4.3 4.7  Chloride 98 - 110 mmol/L 105 100 103  CO2 20 - 32 mmol/L 29 27 28   Calcium 8.6 - 10.3 mg/dL 9.7 9.7 9.4  Total Protein 6.1 - 8.1 g/dL 6.6 6.9 6.8  Total Bilirubin 0.2 - 1.2 mg/dL 0.8 0.8 0.7  Alkaline Phos 40 - 115 U/L - - 53  AST 10 - 40 U/L 22 21 15   ALT 9 - 46 U/L 30 28 17      Carmell Austria, MD 11/19/2019, 11:11 AM  Cc: Hali Marry, *

## 2019-11-19 NOTE — Patient Instructions (Signed)
If you are age 46 or older, your body mass index should be between 23-30. Your Body mass index is 28.31 kg/m. If this is out of the aforementioned range listed, please consider follow up with your Primary Care Provider.  If you are age 51 or younger, your body mass index should be between 19-25. Your Body mass index is 28.31 kg/m. If this is out of the aformentioned range listed, please consider follow up with your Primary Care Provider.   We have sent the following medications to your pharmacy for you to pick up at your convenience: Omeprazole Bentyl  You have been scheduled for an endoscopy. Please follow written instructions given to you at your visit today. If you use inhalers (even only as needed), please bring them with you on the day of your procedure. Your physician has requested that you go to www.startemmi.com and enter the access code given to you at your visit today. This web site gives a general overview about your procedure. However, you should still follow specific instructions given to you by our office regarding your preparation for the procedure.  You [facility] [place] [address] [address]   You are scheduled on 12/03/19 at [place] should arrive 15 minutes prior to your appointment time for registration. Please follow the written instructions below on the day of your exam:  WARNING: IF YOU ARE ALLERGIC TO IODINE/X-RAY DYE, PLEASE NOTIFY RADIOLOGY IMMEDIATELY AT [phone]! YOU WILL BE GIVEN A 13 HOUR PREMEDICATION PREP.  1) Do not eat or drink anything after 7am (4 hours prior to your test) 2) You have been given 2 bottles of oral contrast to drink. The solution may taste better if refrigerated, but do NOT add ice or any other liquid to this solution. Shake well before drinking.    Drink 1 bottle of contrast @ 9am (2 hours prior to your exam)  Drink 1 bottle of  contrast @ 10am (1 hour prior to your exam)  You may take any medications as prescribed with a small amount of water, if necessary. If you take any of the following medications: METFORMIN, GLUCOPHAGE, GLUCOVANCE, AVANDAMET, RIOMET, FORTAMET, [place] MET, JANUMET, GLUMETZA or METAGLIP, you MAY be asked to HOLD this medication 48 hours AFTER the exam.  The purpose of you drinking the oral contrast is to aid in the visualization of your intestinal tract. The contrast solution may cause some diarrhea. Depending on your individual set of symptoms, you may also receive an intravenous injection of x-ray contrast/dye. Plan on being at [facility] for 30 minutes or longer, depending on the type of exam you are having performed.  This test typically takes 30-45 minutes to complete.  If you have any questions regarding your exam or if you need to reschedule, you may call the CT department at [phone] between the hours of 8:00 am and 5:00 pm, Monday-Friday.  ________________________________________________________________________  Please go to the lab at [facility] ([place] You will need to go to level "B", you do not need an appointment for this. Hours available are 7:30 am - 4:30 pm. This will need to be done before your CT Scan or this test will be canceled.   We have sent the following medications to your pharmacy for you to pick up at your convenience: Prednisone 50 mg Take one tablet by mouth 13 hours before CT. Take  one tablet by mouth 7 hours before CT.  Take one tablet by mouth 1 hour before CT.   Benadryl 25 mg one hour before CT   Thank you,  Dr. Jackquline Denmark

## 2019-11-25 ENCOUNTER — Other Ambulatory Visit: Payer: Self-pay | Admitting: Gastroenterology

## 2019-11-25 ENCOUNTER — Ambulatory Visit (INDEPENDENT_AMBULATORY_CARE_PROVIDER_SITE_OTHER): Payer: BC Managed Care – PPO

## 2019-11-25 DIAGNOSIS — Z1159 Encounter for screening for other viral diseases: Secondary | ICD-10-CM

## 2019-11-26 LAB — SARS CORONAVIRUS 2 (TAT 6-24 HRS): SARS Coronavirus 2: NEGATIVE

## 2019-11-27 ENCOUNTER — Other Ambulatory Visit: Payer: Self-pay

## 2019-11-27 ENCOUNTER — Encounter: Payer: Self-pay | Admitting: Gastroenterology

## 2019-11-27 ENCOUNTER — Ambulatory Visit (AMBULATORY_SURGERY_CENTER): Payer: BC Managed Care – PPO | Admitting: Gastroenterology

## 2019-11-27 VITALS — BP 143/87 | HR 87 | Temp 96.2°F | Resp 20 | Ht 71.0 in | Wt 203.0 lb

## 2019-11-27 DIAGNOSIS — R1031 Right lower quadrant pain: Secondary | ICD-10-CM

## 2019-11-27 DIAGNOSIS — K219 Gastro-esophageal reflux disease without esophagitis: Secondary | ICD-10-CM | POA: Diagnosis not present

## 2019-11-27 DIAGNOSIS — K317 Polyp of stomach and duodenum: Secondary | ICD-10-CM | POA: Diagnosis not present

## 2019-11-27 DIAGNOSIS — K2950 Unspecified chronic gastritis without bleeding: Secondary | ICD-10-CM

## 2019-11-27 DIAGNOSIS — R1310 Dysphagia, unspecified: Secondary | ICD-10-CM | POA: Diagnosis not present

## 2019-11-27 MED ORDER — SODIUM CHLORIDE 0.9 % IV SOLN: 500.0000 mL | Freq: Once | INTRAVENOUS | Status: DC

## 2019-11-27 NOTE — Progress Notes (Signed)
Called to room to assist during endoscopic procedure.  Patient ID and intended procedure confirmed with present staff. Received instructions for my participation in the procedure from the performing physician.  

## 2019-11-27 NOTE — Progress Notes (Signed)
Temp JB V/S CW 

## 2019-11-27 NOTE — Progress Notes (Signed)
To PACU VSS. Report to [email or web address] 

## 2019-11-27 NOTE — Op Note (Signed)
[facility] Patient Name: Robert Morris Procedure Date: 11/27/2019 8:26 AM MRN: [id number] Endoscopist: Jackquline Denmark , MD Age: 44 Referring MD:  Date of Birth: 28-Jul-1976 Gender: Male Account #: [id number] Procedure:                Upper GI endoscopy Indications:              GERD with occ Dysphagia Medicines:                Monitored Anesthesia Care Procedure:                Pre-Anesthesia Assessment:                           - Prior to the procedure, a History and Physical                            was performed, and patient medications and                            allergies were reviewed. The patient's tolerance of                            previous anesthesia was also reviewed. The risks                            and benefits of the procedure and the sedation                            options and risks were discussed with the patient.                            All questions were answered, and informed consent                            was obtained. Prior Anticoagulants: The patient has                            taken no previous anticoagulant or antiplatelet                            agents. ASA Grade Assessment: II - A patient with                            mild systemic disease. After reviewing the risks                            and benefits, the patient was deemed in                            satisfactory condition to undergo the procedure.                           After obtaining informed consent, the endoscope was  passed under direct vision. Throughout the                            procedure, the patient's blood pressure, pulse, and                            oxygen saturations were monitored continuously. The                            Endoscope was introduced through the mouth, and                            advanced to the second part of duodenum. The upper                            GI endoscopy was accomplished  without difficulty.                            The patient tolerated the procedure well. Scope In: Scope Out: Findings:                 The esophagus was normal with well-defined Z-line                            at 40 cm. Examined by NBI. Multiple biopsies were                            obtained from proximal mid and distal esophagus to                            r/o EoE. It was decided, however, to proceed with                            dilation of the entire esophagus. Esophagus was                            dilated using 67 [place]. Patient tolerated                            the procedure well.                           Multiple (12-15) 6 to 8 mm semi-sessile polyps with                            no bleeding and no stigmata of recent bleeding were                            found in the gastric fundus, in the gastric body                            and in the gastric antrum. 5 polyps were removed  with a hot snare. Resection and retrieval were                            complete. Minimal antral gastritis. Multiple                            biopsies were also obtained from the antrum and                            body of the stomach to r/o HP.                           The examined duodenum was normal. Biopsies for                            histology were taken with a cold forceps for                            evaluation of celiac disease. Complications:            No immediate complications. Estimated Blood Loss:     Estimated blood loss: none. Impression:               - No endoscopic esophageal abnormality to explain                            patient's dysphagia. Esophagus dilated. Biopsied.                           - Multiple gastric polyps. Resected and retrieved.                           - Mild gastritis. Recommendation:           - Patient has a contact number available for                            emergencies. The signs and  symptoms of potential                            delayed complications were discussed with the                            patient. Return to normal activities tomorrow.                            Written discharge instructions were provided to the                            patient.                           - Post dilatation diet.                           - Continue present medications.                           -  Await pathology results.                           - No aspirin, ibuprofen, naproxen, or other                            non-steroidal anti-inflammatory drugs for 5 days                            after polyp removal.                           - Return to GI clinic in 12 weeks. Jackquline Denmark, MD 11/27/2019 8:52:38 AM This report has been signed electronically.

## 2019-11-27 NOTE — Patient Instructions (Signed)
Handouts given to you today on gastritis and post dilation diet.  No aspirin, ibuprofen, naproxen, or other non-steriodal anti-inflammatory drugs (NSAIDs) for 5 days. Can use tylenol for pain if needed.  Await pathology results.   YOU HAD AN ENDOSCOPIC PROCEDURE TODAY AT [facility]:   Refer to the procedure report that was given to you for any specific questions about what was found during the examination.  If the procedure report does not answer your questions, please call your gastroenterologist to clarify.  If you requested that your care partner not be given the details of your procedure findings, then the procedure report has been included in a sealed envelope for you to review at your convenience later.  YOU SHOULD EXPECT: Some feelings of bloating in the abdomen. Passage of more gas than usual.  Walking can help get rid of the air that was put into your GI tract during the procedure and reduce the bloating. If you had a lower endoscopy (such as a colonoscopy or flexible sigmoidoscopy) you may notice spotting of blood in your stool or on the toilet paper. If you underwent a bowel prep for your procedure, you may not have a normal bowel movement for a few days.  Please Note:  You might notice some irritation and congestion in your nose or some drainage.  This is from the oxygen used during your procedure.  There is no need for concern and it should clear up in a day or so.  SYMPTOMS TO REPORT IMMEDIATELY:    Following upper endoscopy (EGD)  Vomiting of blood or coffee ground material  New chest pain or pain under the shoulder blades  Painful or persistently difficult swallowing  New shortness of breath  Fever of 100F or higher  Black, tarry-looking stools  For urgent or emergent issues, a gastroenterologist can be reached at any hour by calling [phone].   DIET:  We do recommend a small meal at first, but then you may proceed to your regular diet.  Drink plenty  of fluids but you should avoid alcoholic beverages for 24 hours.  ACTIVITY:  You should plan to take it easy for the rest of today and you should NOT DRIVE or use heavy machinery until tomorrow (because of the sedation medicines used during the test).    FOLLOW UP: Our staff will call the number listed on your records 48-72 hours following your procedure to check on you and address any questions or concerns that you may have regarding the information given to you following your procedure. If we do not reach you, we will leave a message.  We will attempt to reach you two times.  During this call, we will ask if you have developed any symptoms of COVID 19. If you develop any symptoms (ie: fever, flu-like symptoms, shortness of breath, cough etc.) before then, please call [phone].  If you test positive for Covid 19 in the 2 weeks post procedure, please call and report this information to [place].    If any biopsies were taken you will be contacted by phone or by letter within the next 1-3 weeks.  Please call us at [phone] if you have not heard about the biopsies in 3 weeks.    SIGNATURES/CONFIDENTIALITY: You and/or your care partner have signed paperwork which will be entered into your electronic medical record.  These signatures attest to the fact that that the information above on your After Visit Summary has been reviewed and is understood.  Full responsibility of the confidentiality of this discharge information lies with you and/or your care-partner.

## 2019-11-29 ENCOUNTER — Telehealth: Payer: Self-pay | Admitting: *Deleted

## 2019-11-29 NOTE — Telephone Encounter (Signed)
  Follow up Call-  Call back number 11/27/2019  Post procedure Call Back phone  # [phone]  Permission to leave phone message Yes  Some recent data might be hidden     Patient questions:  Do you have a fever, pain , or abdominal swelling? No. Pain Score  0 *  Have you tolerated food without any problems? Yes.    Have you been able to return to your normal activities? Yes.    Do you have any questions about your discharge instructions: Diet   No. Medications  No. Follow up visit  No.  Do you have questions or concerns about your Care? No.  Actions: * If pain score is 4 or above: No action needed, pain <4.  1. Have you developed a fever since your procedure? no  2.   Have you had an respiratory symptoms (SOB or cough) since your procedure? no  3.   Have you tested positive for COVID 19 since your procedure no  4.   Have you had any family members/close contacts diagnosed with the COVID 19 since your procedure?  no   If yes to any of these questions please route to Joylene John, RN and Alphonsa Gin, Therapist, sports.

## 2019-11-30 ENCOUNTER — Encounter: Payer: Self-pay | Admitting: Gastroenterology

## 2019-12-03 ENCOUNTER — Other Ambulatory Visit: Payer: Self-pay

## 2019-12-03 ENCOUNTER — Other Ambulatory Visit (INDEPENDENT_AMBULATORY_CARE_PROVIDER_SITE_OTHER): Payer: BC Managed Care – PPO

## 2019-12-03 ENCOUNTER — Ambulatory Visit (HOSPITAL_BASED_OUTPATIENT_CLINIC_OR_DEPARTMENT_OTHER)
Admission: RE | Admit: 2019-12-03 | Discharge: 2019-12-03 | Disposition: A | Discharge status: Home / Self Care | Payer: BC Managed Care – PPO | Source: Ambulatory Visit | Attending: Gastroenterology | Admitting: Gastroenterology

## 2019-12-03 ENCOUNTER — Encounter (HOSPITAL_BASED_OUTPATIENT_CLINIC_OR_DEPARTMENT_OTHER): Payer: Self-pay

## 2019-12-03 DIAGNOSIS — R1031 Right lower quadrant pain: Secondary | ICD-10-CM

## 2019-12-03 LAB — COMPREHENSIVE METABOLIC PANEL
ALT: 30 U/L (ref 0–53)
AST: 18 U/L (ref 0–37)
Albumin: 5 g/dL (ref 3.5–5.2)
Alkaline Phosphatase: 57 U/L (ref 39–117)
BUN: 14 mg/dL (ref 6–23)
CO2: 25 mEq/L (ref 19–32)
Calcium: 10.1 mg/dL (ref 8.4–10.5)
Chloride: 99 mEq/L (ref 96–112)
Creatinine, Ser: 1.2 mg/dL (ref 0.40–1.50)
GFR: 65.42 mL/min (ref >60.00)
Glucose, Bld: 133 mg/dL — ABNORMAL HIGH (ref 70–99)
Potassium: 4.3 mEq/L (ref 3.5–5.1)
Sodium: 136 mEq/L (ref 135–145)
Total Bilirubin: 0.9 mg/dL (ref 0.2–1.2)
Total Protein: 7.9 g/dL (ref 6.0–8.3)

## 2019-12-03 MED ORDER — IOHEXOL 300 MG/ML  SOLN: 100.0000 mL | Freq: Once | INTRAMUSCULAR | Status: DC | PRN

## 2019-12-03 MED ORDER — PREDNISONE 50 MG PO TABS: ORAL_TABLET | ORAL | 0 refills | Status: DC

## 2019-12-05 ENCOUNTER — Ambulatory Visit (HOSPITAL_BASED_OUTPATIENT_CLINIC_OR_DEPARTMENT_OTHER)
Admission: RE | Admit: 2019-12-05 | Discharge: 2019-12-05 | Disposition: A | Discharge status: Home / Self Care | Payer: BC Managed Care – PPO | Source: Ambulatory Visit | Attending: Gastroenterology | Admitting: Gastroenterology

## 2019-12-05 ENCOUNTER — Other Ambulatory Visit: Payer: Self-pay

## 2019-12-05 ENCOUNTER — Encounter (HOSPITAL_BASED_OUTPATIENT_CLINIC_OR_DEPARTMENT_OTHER): Payer: Self-pay

## 2019-12-05 DIAGNOSIS — R1031 Right lower quadrant pain: Secondary | ICD-10-CM | POA: Diagnosis not present

## 2019-12-05 MED ORDER — IOHEXOL 300 MG/ML  SOLN
100.0000 mL | Freq: Once | INTRAMUSCULAR | Status: AC | PRN
  Administered 2019-12-05: 100 mL via INTRAVENOUS

## 2019-12-11 ENCOUNTER — Other Ambulatory Visit: Payer: Self-pay | Admitting: Gastroenterology

## 2019-12-24 ENCOUNTER — Other Ambulatory Visit: Payer: Self-pay | Admitting: Family Medicine

## 2019-12-24 DIAGNOSIS — I10 Essential (primary) hypertension: Secondary | ICD-10-CM

## 2020-01-10 ENCOUNTER — Encounter (HOSPITAL_BASED_OUTPATIENT_CLINIC_OR_DEPARTMENT_OTHER): Payer: BC Managed Care – PPO | Admitting: Internal Medicine

## 2020-01-15 ENCOUNTER — Encounter: Payer: Self-pay | Admitting: Family Medicine

## 2020-01-16 ENCOUNTER — Other Ambulatory Visit: Payer: Self-pay

## 2020-01-16 MED ORDER — DICYCLOMINE HCL 10 MG PO CAPS: 10.0000 mg | ORAL_CAPSULE | Freq: Two times a day (BID) | ORAL | 3 refills | Status: DC

## 2020-01-23 ENCOUNTER — Ambulatory Visit: Payer: BC Managed Care – PPO | Attending: Internal Medicine

## 2020-01-23 DIAGNOSIS — Z23 Encounter for immunization: Secondary | ICD-10-CM

## 2020-01-23 NOTE — Progress Notes (Signed)
   Covid-19 Vaccination Clinic  Name:  Robert Morris    MRN: [id number] DOB: 09-28-1976  01/23/2020  [name] was observed post Covid-19 immunization for 15 minutes without incident. He was provided with Vaccine Information Sheet and instruction to access the V-Safe system.   [name] was instructed to call 911 with any severe reactions post vaccine: Marland Kitchen Difficulty breathing  . Swelling of face and throat  . A fast heartbeat  . A bad rash all over body  . Dizziness and weakness   Immunizations Administered    Name Date Dose VIS Date Route   Pfizer COVID-19 Vaccine 01/23/2020  8:45 AM 0.3 mL 10/25/2019 Intramuscular   Manufacturer: [place]   Lot: [id number]   [place]: [id number]

## 2020-02-03 ENCOUNTER — Ambulatory Visit: Payer: BC Managed Care – PPO | Attending: Internal Medicine

## 2020-02-03 DIAGNOSIS — Z20822 Contact with and (suspected) exposure to covid-19: Secondary | ICD-10-CM

## 2020-02-04 LAB — NOVEL CORONAVIRUS, NAA: SARS-CoV-2, NAA: NOT DETECTED

## 2020-02-04 LAB — SARS-COV-2, NAA 2 DAY TAT

## 2020-02-24 ENCOUNTER — Ambulatory Visit: Payer: BC Managed Care – PPO | Attending: Internal Medicine

## 2020-02-24 DIAGNOSIS — Z23 Encounter for immunization: Secondary | ICD-10-CM

## 2020-02-24 NOTE — Progress Notes (Signed)
   Covid-19 Vaccination Clinic  Name:  Robert Morris    MRN: [id number] DOB: 1976-08-14  02/24/2020  Mr. Florian was observed post Covid-19 immunization for 15 minutes without incident. He was provided with Vaccine Information Sheet and instruction to access the V-Safe system.   Mr. Bosman was instructed to call 911 with any severe reactions post vaccine: Marland Kitchen Difficulty breathing  . Swelling of face and throat  . A fast heartbeat  . A bad rash all over body  . Dizziness and weakness   Immunizations Administered    Name Date Dose VIS Date Route   Pfizer COVID-19 Vaccine 02/24/2020  8:13 AM 0.3 mL 10/25/2019 Intramuscular   Manufacturer: [place]   Lot: [id number]   [place]: [id number]

## 2020-04-16 ENCOUNTER — Encounter: Payer: Self-pay | Admitting: Family Medicine

## 2020-04-16 ENCOUNTER — Ambulatory Visit (INDEPENDENT_AMBULATORY_CARE_PROVIDER_SITE_OTHER): Payer: BC Managed Care – PPO | Admitting: Family Medicine

## 2020-04-16 VITALS — BP 134/85 | HR 74 | Ht 71.0 in | Wt 193.0 lb

## 2020-04-16 DIAGNOSIS — F40243 Fear of flying: Secondary | ICD-10-CM

## 2020-04-16 DIAGNOSIS — I10 Essential (primary) hypertension: Secondary | ICD-10-CM | POA: Diagnosis not present

## 2020-04-16 DIAGNOSIS — R0789 Other chest pain: Secondary | ICD-10-CM | POA: Diagnosis not present

## 2020-04-16 MED ORDER — ALPRAZOLAM 0.5 MG PO TABS: 0.5000 mg | ORAL_TABLET | Freq: Once | ORAL | 0 refills | Status: AC

## 2020-04-16 NOTE — Assessment & Plan Note (Signed)
Can use xanax prn for flying. Warned about potential S.E.

## 2020-04-16 NOTE — Assessment & Plan Note (Signed)
Well controlled. Continue current regimen. Follow up in  6 mo. Will be due for labs at f/u.

## 2020-04-16 NOTE — Progress Notes (Signed)
Established Patient Office Visit  Subjective:  Patient ID: Robert Morris, male    DOB: 1976-05-01  Age: 44 y.o. MRN: [id number]  CC:  Chief Complaint  Patient presents with  . Hypertension    HPI Robert Morris presents for   Hypertension- Pt denies chest pain, SOB, dizziness, or heart palpitations.  Taking meds as directed w/o problems.  Denies medication side effects.  Reports home blood pressures have been at goal.  He still been getting some intermittent chest pain that he feels has been a little bit more frequent he is just not sure if it is GI related or not.  He says he had a treadmill stress test maybe 5 years ago maybe even a little bit longer and it was normal at the time.  He has had an endoscopy since I last saw him but everything checked out normal he also had a CT scan.  He was recently started on Bentyl by GI.  He thinks it is been somewhat helpful but he is just not sure.  The chest pain can occur even if he has not eaten anything but sometimes it will actually radiate towards his back between the shoulder blade and the spine.  Also wanted to know if he could have a medication for flying.  He and his wife are going on vacation this summer and he does get very anxious with flying.  He has never taken anything before for this.    Past Medical History:  Diagnosis Date  . GERD (gastroesophageal reflux disease)   . GERD (gastroesophageal reflux disease)   . Hypertension   . Pityriasis rosea     Past Surgical History:  Procedure Laterality Date  . ESOPHAGOGASTRODUODENOSCOPY  2018  . SHOULDER SURGERY  1998   Left, repair of labrum  . UMBILICAL HERNIA REPAIR  1996    Family History  Problem Relation Age of Onset  . Hyperlipidemia Father   . Prostate cancer Father   . Bipolar disorder Brother   . Asthma Mother   . Cirrhosis Mother   . Cirrhosis Maternal Grandmother   . Colon cancer Neg Hx   . Esophageal cancer Neg Hx   . Rectal cancer Neg Hx   . Stomach  cancer Neg Hx     Social History   Socioeconomic History  . Marital status: Married    Spouse name: Stacie  . Number of children: 1  . Years of education: Not on file  . Highest education level: Not on file  Occupational History  . Occupation: [employer]  Tobacco Use  . Smoking status: Never Smoker  . Smokeless tobacco: Never Used  Substance and Sexual Activity  . Alcohol use: Yes    Alcohol/week: 3.0 standard drinks    Types: 3 Cans of beer per week  . Drug use: No  . Sexual activity: Yes    Partners: Female  Other Topics Concern  . Not on file  Social History Narrative   Occ exercise. Works for [employer].    Social Determinants of Health   Financial Resource Strain:   . Difficulty of Paying Living Expenses:   Food Insecurity:   . Worried About Charity fundraiser in the Last Year:   . Arboriculturist in the Last Year:   Transportation Needs:   . Film/video editor (Medical):   Marland Kitchen Lack of Transportation (Non-Medical):   Physical Activity:   . Days of Exercise per Week:   .  Minutes of Exercise per Session:   Stress:   . Feeling of Stress :   Social Connections:   . Frequency of Communication with Friends and Family:   . Frequency of Social Gatherings with Friends and Family:   . Attends Religious Services:   . Active Member of Clubs or Organizations:   . Attends Archivist Meetings:   Marland Kitchen Marital Status:   Intimate Partner Violence:   . Fear of Current or Ex-Partner:   . Emotionally Abused:   Marland Kitchen Physically Abused:   . Sexually Abused:     Outpatient Medications Prior to Visit  Medication Sig Dispense Refill  . amLODipine (NORVASC) 5 MG tablet TAKE 1 TABLET BY MOUTH EVERY DAY 90 tablet 1  . dicyclomine (BENTYL) 10 MG capsule Take 1 capsule (10 mg total) by mouth 2 (two) times daily. 60 capsule 3  . omeprazole (PRILOSEC) 20 MG capsule Take 1 capsule (20 mg total) by mouth 2 (two) times daily. 60 capsule 6  . famotidine  (PEPCID) 20 MG tablet Take 1 tablet (20 mg total) by mouth 2 (two) times daily. 180 tablet 3  . predniSONE (DELTASONE) 50 MG tablet We have sent the following medications to your pharmacy for you to pick up at your convenience: Prednisone 50 mg Take one tablet by mouth 13 hours before CT. Take one tablet by mouth 7 hours before CT.  Take one tablet by mouth 1 hour before CT.   Benadryl 25 mg one hour before CT 4 tablet 0   No facility-administered medications prior to visit.    Allergies  Allergen Reactions  . Iodine     Had a reaction to IV dye with skin rash  . Losartan Cough    Cough  . Lisinopril     REACTION: cough Other reaction(s): Cough REACTION: cough    ROS Review of Systems    Objective:    Physical Exam  Constitutional: He is oriented to person, place, and time. He appears well-developed and well-nourished.  HENT:  Head: Normocephalic and atraumatic.  Cardiovascular: Normal rate, regular rhythm and normal heart sounds.  Pulmonary/Chest: Effort normal and breath sounds normal.  Neurological: He is alert and oriented to person, place, and time.  Skin: Skin is warm and dry.  Psychiatric: He has a normal mood and affect. His behavior is normal.    BP 134/85   Pulse 74   Ht 5\' 11"  (1.803 m)   Wt 193 lb (87.5 kg)   SpO2 100%   BMI 26.92 kg/m  Wt Readings from Last 3 Encounters:  04/16/20 193 lb (87.5 kg)  11/27/19 203 lb (92.1 kg)  11/19/19 203 lb (92.1 kg)     There are no preventive care reminders to display for this patient.  There are no preventive care reminders to display for this patient.  Lab Results  Component Value Date   TSH 6.23 (H) 03/08/2016   Lab Results  Component Value Date   WBC 6.6 03/08/2016   HGB 15.7 03/08/2016   HCT 45.0 03/08/2016   MCV 85.6 03/08/2016   PLT 205 03/08/2016   Lab Results  Component Value Date   NA 136 12/03/2019   K 4.3 12/03/2019   CO2 25 12/03/2019   GLUCOSE 133 (H) 12/03/2019   BUN 14  12/03/2019   CREATININE 1.20 12/03/2019   BILITOT 0.9 12/03/2019   ALKPHOS 57 12/03/2019   AST 18 12/03/2019   ALT 30 12/03/2019   PROT 7.9 12/03/2019   ALBUMIN  5.0 12/03/2019   CALCIUM 10.1 12/03/2019   GFR 65.42 12/03/2019   Lab Results  Component Value Date   CHOL 215 (H) 10/07/2019   Lab Results  Component Value Date   HDL 37 (L) 10/07/2019   Lab Results  Component Value Date   LDLCALC 132 (H) 10/07/2019   Lab Results  Component Value Date   TRIG 324 (H) 10/07/2019   Lab Results  Component Value Date   CHOLHDL 5.8 (H) 10/07/2019   No results found for: HGBA1C    Assessment & Plan:   Problem List Items Addressed This Visit      Cardiovascular and Mediastinum   Essential hypertension - Primary    Well controlled. Continue current regimen. Follow up in  6 mo. Will be due for labs at f/u.          Other   Anxiety with flying    Can use xanax prn for flying. Warned about potential S.E.       Relevant Medications   ALPRAZolam (XANAX) 0.5 MG tablet    Other Visit Diagnoses    Atypical chest pain       Relevant Orders   Exercise Tolerance Test     Atypical chest pain-we will check to see when the last stress test was if it is been 5 years or more than will consider reordering the test. Last ETT was 06/2015.  May still be GI related. On PPI daily.    Meds ordered this encounter  Medications  . ALPRAZolam (XANAX) 0.5 MG tablet    Sig: Take 1 tablet (0.5 mg total) by mouth once for 1 dose. Ok to repeat in 1 hour if needed. For Flying    Dispense:  4 tablet    Refill:  0    Follow-up: Return in about 6 months (around 10/16/2020) for Hypertension.    Beatrice Lecher, MD

## 2020-07-27 ENCOUNTER — Other Ambulatory Visit: Payer: Self-pay | Admitting: Gastroenterology

## 2020-08-13 ENCOUNTER — Other Ambulatory Visit: Payer: Self-pay | Admitting: Family Medicine

## 2020-08-13 DIAGNOSIS — I10 Essential (primary) hypertension: Secondary | ICD-10-CM

## 2020-10-15 ENCOUNTER — Other Ambulatory Visit: Payer: Self-pay

## 2020-10-15 ENCOUNTER — Other Ambulatory Visit: Payer: Self-pay | Admitting: Family Medicine

## 2020-10-15 ENCOUNTER — Ambulatory Visit (INDEPENDENT_AMBULATORY_CARE_PROVIDER_SITE_OTHER): Payer: BC Managed Care – PPO | Admitting: Family Medicine

## 2020-10-15 ENCOUNTER — Encounter: Payer: Self-pay | Admitting: Family Medicine

## 2020-10-15 VITALS — BP 136/83 | HR 78 | Ht 71.0 in | Wt 195.0 lb

## 2020-10-15 DIAGNOSIS — Z1211 Encounter for screening for malignant neoplasm of colon: Secondary | ICD-10-CM

## 2020-10-15 DIAGNOSIS — R0683 Snoring: Secondary | ICD-10-CM

## 2020-10-15 DIAGNOSIS — Z23 Encounter for immunization: Secondary | ICD-10-CM

## 2020-10-15 DIAGNOSIS — I10 Essential (primary) hypertension: Secondary | ICD-10-CM

## 2020-10-15 DIAGNOSIS — R232 Flushing: Secondary | ICD-10-CM

## 2020-10-15 DIAGNOSIS — Z5320 Procedure and treatment not carried out because of patient's decision for unspecified reasons: Secondary | ICD-10-CM

## 2020-10-15 DIAGNOSIS — R0681 Apnea, not elsewhere classified: Secondary | ICD-10-CM

## 2020-10-15 NOTE — Progress Notes (Signed)
Established Patient Office Visit  Subjective:  Patient ID: Robert Morris, male    DOB: [birth year]  Age: 44 y.o. MRN: [id number]  CC:  Chief Complaint  Patient presents with  . Hypertension  . Sleep Apnea    HPI Robert Morris presents for   Hypertension- Pt denies chest pain, SOB, dizziness, or heart palpitations.  Taking meds as directed w/o problems.  Denies medication side effects.  Brought in home cuff.  Still blood pressures are in the 120s with a few in the 130s.  He has noticed a little bit more flushing lately but says that sometimes when he starts to feel anxious he can tell his blood pressures going up and he will feel flushed at the same time.  He also reports that he has been having some occasional apneic events where he wakes up and feels like he cannot breathe he will just have to drink a little bit of water and that usually helps.  He really feels like it is related to his severe reflux disease and probably not apnea but he is not sure.  In fact we had ordered a sleep study at one point in time but he says he never actually scheduled it and went.   Past Medical History:  Diagnosis Date  . GERD (gastroesophageal reflux disease)   . GERD (gastroesophageal reflux disease)   . Hypertension   . Pityriasis rosea     Past Surgical History:  Procedure Laterality Date  . ESOPHAGOGASTRODUODENOSCOPY  2018  . SHOULDER SURGERY  1998   Left, repair of labrum  . UMBILICAL HERNIA REPAIR  1996    Family History  Problem Relation Age of Onset  . Hyperlipidemia Father   . Prostate cancer Father   . Bipolar disorder Brother   . Asthma Mother   . Cirrhosis Mother   . Cirrhosis Maternal Grandmother   . Colon cancer Neg Hx   . Esophageal cancer Neg Hx   . Rectal cancer Neg Hx   . Stomach cancer Neg Hx     Social History   Socioeconomic History  . Marital status: Married    Spouse name: Stacie  . Number of children: 1  . Years of education: Not on file  .  Highest education level: Not on file  Occupational History  . Occupation: [employer]  Tobacco Use  . Smoking status: Never Smoker  . Smokeless tobacco: Never Used  Vaping Use  . Vaping Use: Never used  Substance and Sexual Activity  . Alcohol use: Yes    Alcohol/week: 3.0 standard drinks    Types: 3 Cans of beer per week  . Drug use: No  . Sexual activity: Yes    Partners: Female  Other Topics Concern  . Not on file  Social History Narrative   Occ exercise. Works for [employer].    Social Determinants of Health   Financial Resource Strain:   . Difficulty of Paying Living Expenses: Not on file  Food Insecurity:   . Worried About Charity fundraiser in the Last Year: Not on file  . Ran Out of Food in the Last Year: Not on file  Transportation Needs:   . Lack of Transportation (Medical): Not on file  . Lack of Transportation (Non-Medical): Not on file  Physical Activity:   . Days of Exercise per Week: Not on file  . Minutes of Exercise per Session: Not on file  Stress:   . Feeling of Stress :  Not on file  Social Connections:   . Frequency of Communication with Friends and Family: Not on file  . Frequency of Social Gatherings with Friends and Family: Not on file  . Attends Religious Services: Not on file  . Active Member of Clubs or Organizations: Not on file  . Attends Archivist Meetings: Not on file  . Marital Status: Not on file  Intimate Partner Violence:   . Fear of Current or Ex-Partner: Not on file  . Emotionally Abused: Not on file  . Physically Abused: Not on file  . Sexually Abused: Not on file    Outpatient Medications Prior to Visit  Medication Sig Dispense Refill  . amLODipine (NORVASC) 5 MG tablet TAKE 1 TABLET BY MOUTH EVERY DAY 90 tablet 1  . dicyclomine (BENTYL) 10 MG capsule TAKE 1 CAPSULE (10 MG TOTAL) BY MOUTH 2 (TWO) TIMES DAILY. 180 capsule 0  . omeprazole (PRILOSEC) 20 MG capsule Take 1 capsule (20 mg total) by  mouth 2 (two) times daily. 60 capsule 6   No facility-administered medications prior to visit.    Allergies  Allergen Reactions  . Iodine     Had a reaction to IV dye with skin rash  . Losartan Cough    Cough  . Lisinopril     REACTION: cough Other reaction(s): Cough REACTION: cough    ROS Review of Systems    Objective:    Physical Exam Constitutional:      Appearance: He is well-developed.  HENT:     Head: Normocephalic and atraumatic.  Cardiovascular:     Rate and Rhythm: Normal rate and regular rhythm.     Heart sounds: Normal heart sounds.  Pulmonary:     Effort: Pulmonary effort is normal.     Breath sounds: Normal breath sounds.  Skin:    General: Skin is warm and dry.  Neurological:     Mental Status: He is alert and oriented to person, place, and time.  Psychiatric:        Behavior: Behavior normal.     BP 136/83   Pulse 78   Ht 5\' 11"  (1.803 m)   Wt 195 lb (88.5 kg)   SpO2 99%   BMI 27.20 kg/m  Wt Readings from Last 3 Encounters:  10/15/20 195 lb (88.5 kg)  04/16/20 193 lb (87.5 kg)  11/27/19 203 lb (92.1 kg)     Health Maintenance Due  Topic Date Due  . Hepatitis C Screening  Never done    There are no preventive care reminders to display for this patient.  Lab Results  Component Value Date   TSH 6.23 (H) 03/08/2016   Lab Results  Component Value Date   WBC 6.6 03/08/2016   HGB 15.7 03/08/2016   HCT 45.0 03/08/2016   MCV 85.6 03/08/2016   PLT 205 03/08/2016   Lab Results  Component Value Date   NA 136 12/03/2019   K 4.3 12/03/2019   CO2 25 12/03/2019   GLUCOSE 133 (H) 12/03/2019   BUN 14 12/03/2019   CREATININE 1.20 12/03/2019   BILITOT 0.9 12/03/2019   ALKPHOS 57 12/03/2019   AST 18 12/03/2019   ALT 30 12/03/2019   PROT 7.9 12/03/2019   ALBUMIN 5.0 12/03/2019   CALCIUM 10.1 12/03/2019   GFR 65.42 12/03/2019   Lab Results  Component Value Date   CHOL 215 (H) 10/07/2019   Lab Results  Component Value Date    HDL 37 (L) 10/07/2019   Lab  Results  Component Value Date   LDLCALC 132 (H) 10/07/2019   Lab Results  Component Value Date   TRIG 324 (H) 10/07/2019   Lab Results  Component Value Date   CHOLHDL 5.8 (H) 10/07/2019   No results found for: HGBA1C    Assessment & Plan:   Problem List Items Addressed This Visit      Cardiovascular and Mediastinum   Essential hypertension - Primary    Blood pressure is okay today.  I had really like to see that systolic less than 887 but most of the home blood pressures actually look good on his blood pressure log.  Continue to monitor and continue to take blood pressure medication really encouraged him to make sure that he is eating a low-sodium diet.  This could also be contributing to some of the flushing that he is experiencing.  Refill sent to pharmacy.      Relevant Orders   COMPLETE METABOLIC PANEL WITH GFR   Lipid panel   TSH   CBC     Other   Snoring    Other Visit Diagnoses    Screening for hepatitis C declined       Relevant Orders   Hepatitis C Antibody   Need for immunization against influenza       Relevant Orders   Flu Vaccine QUAD 36+ mos IM (Completed)   Flushing       Relevant Orders   COMPLETE METABOLIC PANEL WITH GFR   Lipid panel   TSH   CBC   Apnea       Relevant Orders   Home sleep test   Screen for colon cancer       Relevant Orders   Ambulatory referral to Gastroenterology      Apnea-had him complete a stop bang questionnaire today.  We will go ahead and place referral for home sleep study.  Flushing-likely related to his blood pressure and anxiety.  But will also check for thyroid disorder.  No orders of the defined types were placed in this encounter.   Follow-up: Return in about 6 months (around 04/15/2021) for Hypertension.    Beatrice Lecher, MD

## 2020-10-15 NOTE — Progress Notes (Signed)
bp home readings:  135/84 p 85  127/76 p 96  123/77 p 80  122/74 p 84  124/81 p 81  137/85 p 81  136/84 p 79  121/77 p 84  124/80 p 77  120/88 p 96

## 2020-10-15 NOTE — Assessment & Plan Note (Signed)
Blood pressure is okay today.  I had really like to see that systolic less than 584 but most of the home blood pressures actually look good on his blood pressure log.  Continue to monitor and continue to take blood pressure medication really encouraged him to make sure that he is eating a low-sodium diet.  This could also be contributing to some of the flushing that he is experiencing.  Refill sent to pharmacy.

## 2020-10-16 LAB — COMPLETE METABOLIC PANEL WITH GFR
AG Ratio: 2.1 (calc) (ref 1.0–2.5)
ALT: 22 U/L (ref 9–46)
AST: 17 U/L (ref 10–40)
Albumin: 4.8 g/dL (ref 3.6–5.1)
Alkaline phosphatase (APISO): 55 U/L (ref 36–130)
BUN: 17 mg/dL (ref 7–25)
CO2: 25 mmol/L (ref 20–32)
Calcium: 10.1 mg/dL (ref 8.6–10.3)
Chloride: 103 mmol/L (ref 98–110)
Creat: 1.18 mg/dL (ref 0.60–1.35)
GFR, Est African American: 85 mL/min/{1.73_m2} (ref >=60)
GFR, Est Non African American: 74 mL/min/{1.73_m2} (ref >=60)
Globulin: 2.3 g/dL (calc) (ref 1.9–3.7)
Glucose, Bld: 84 mg/dL (ref 65–99)
Potassium: 4.2 mmol/L (ref 3.5–5.3)
Sodium: 142 mmol/L (ref 135–146)
Total Bilirubin: 0.8 mg/dL (ref 0.2–1.2)
Total Protein: 7.1 g/dL (ref 6.1–8.1)

## 2020-10-16 LAB — LIPID PANEL
Cholesterol: 235 mg/dL — ABNORMAL HIGH (ref <200)
HDL: 45 mg/dL (ref >=40)
LDL Cholesterol (Calc): 140 mg/dL (calc) — ABNORMAL HIGH
Non-HDL Cholesterol (Calc): 190 mg/dL (calc) — ABNORMAL HIGH (ref <130)
Total CHOL/HDL Ratio: 5.2 (calc) — ABNORMAL HIGH (ref <5.0)
Triglycerides: 324 mg/dL — ABNORMAL HIGH (ref <150)

## 2020-10-16 LAB — CBC
HCT: 46.5 % (ref 38.5–50.0)
Hemoglobin: 16.3 g/dL (ref 13.2–17.1)
MCH: 29.9 pg (ref 27.0–33.0)
MCHC: 35.1 g/dL (ref 32.0–36.0)
MCV: 85.3 fL (ref 80.0–100.0)
MPV: 9.7 fL (ref 7.5–12.5)
Platelets: 272 10*3/uL (ref 140–400)
RBC: 5.45 10*6/uL (ref 4.20–5.80)
RDW: 13.2 % (ref 11.0–15.0)
WBC: 7.5 10*3/uL (ref 3.8–10.8)

## 2020-10-16 LAB — HEPATITIS C ANTIBODY
Hepatitis C Ab: NONREACTIVE
SIGNAL TO CUT-OFF: 0.01 (ref <1.00)

## 2020-10-16 LAB — TSH: TSH: 7.07 mIU/L — ABNORMAL HIGH (ref 0.40–4.50)

## 2020-10-19 ENCOUNTER — Encounter: Payer: Self-pay | Admitting: Family Medicine

## 2020-10-19 DIAGNOSIS — E038 Other specified hypothyroidism: Secondary | ICD-10-CM

## 2020-10-20 DIAGNOSIS — E038 Other specified hypothyroidism: Secondary | ICD-10-CM | POA: Insufficient documentation

## 2020-10-20 DIAGNOSIS — E039 Hypothyroidism, unspecified: Secondary | ICD-10-CM | POA: Insufficient documentation

## 2020-10-21 ENCOUNTER — Other Ambulatory Visit: Payer: Self-pay | Admitting: Gastroenterology

## 2020-11-20 ENCOUNTER — Other Ambulatory Visit: Payer: Self-pay | Admitting: Gastroenterology

## 2020-11-26 ENCOUNTER — Ambulatory Visit (INDEPENDENT_AMBULATORY_CARE_PROVIDER_SITE_OTHER): Payer: BC Managed Care – PPO | Admitting: Family Medicine

## 2020-11-26 ENCOUNTER — Ambulatory Visit (INDEPENDENT_AMBULATORY_CARE_PROVIDER_SITE_OTHER): Payer: BC Managed Care – PPO

## 2020-11-26 ENCOUNTER — Other Ambulatory Visit: Payer: Self-pay

## 2020-11-26 ENCOUNTER — Telehealth: Payer: Self-pay

## 2020-11-26 ENCOUNTER — Encounter: Payer: Self-pay | Admitting: Family Medicine

## 2020-11-26 VITALS — BP 139/86 | HR 78 | Ht 71.0 in | Wt 196.0 lb

## 2020-11-26 DIAGNOSIS — R030 Elevated blood-pressure reading, without diagnosis of hypertension: Secondary | ICD-10-CM | POA: Diagnosis not present

## 2020-11-26 DIAGNOSIS — M25472 Effusion, left ankle: Secondary | ICD-10-CM | POA: Diagnosis not present

## 2020-11-26 DIAGNOSIS — M25471 Effusion, right ankle: Secondary | ICD-10-CM

## 2020-11-26 DIAGNOSIS — R0789 Other chest pain: Secondary | ICD-10-CM

## 2020-11-26 DIAGNOSIS — E781 Pure hyperglyceridemia: Secondary | ICD-10-CM | POA: Diagnosis not present

## 2020-11-26 DIAGNOSIS — R079 Chest pain, unspecified: Secondary | ICD-10-CM | POA: Diagnosis not present

## 2020-11-26 NOTE — Telephone Encounter (Signed)
Brad sent a message to schedule an appointment for Chest Pain. The front alerted triage to call. I called and left a message for a return call.

## 2020-11-26 NOTE — Progress Notes (Signed)
Acute Office Visit  Subjective:    Patient ID: [place], male    DOB: May 26, 1976, 45 y.o.   MRN: [id number]  Chief Complaint  Patient presents with  . Chest Pain    HPI Patient is in today for right-sided chest pain.  No known triggers or causes though he has been doing some yard work about 2 days prior and he noticed he felt a little bit more tired over the weekend.  He denies any shortness of breath dizziness or arm pain.  He did get some relief after taking some Tylenol. No recent palpitations but has had them in the past.  Chest pain is worse with deep breath.  It is constant but fluctuates in intensity.  Family history of heart disease in multiple family members.  Says the pain does seem to radiate back towards his scapula at times.  No abdominal or epigastric pain.  He has noticed a little bit more swelling around his ankles in the evening.  No nausea or GI symptoms.  Past Medical History:  Diagnosis Date  . GERD (gastroesophageal reflux disease)   . GERD (gastroesophageal reflux disease)   . Hypertension   . Pityriasis rosea     Past Surgical History:  Procedure Laterality Date  . ESOPHAGOGASTRODUODENOSCOPY  2018  . SHOULDER SURGERY  1998   Left, repair of labrum  . UMBILICAL HERNIA REPAIR  1996    Family History  Problem Relation Age of Onset  . Hyperlipidemia Father   . Prostate cancer Father   . Bipolar disorder Brother   . Asthma Mother   . Cirrhosis Mother   . Cirrhosis Maternal Grandmother   . Colon cancer Neg Hx   . Esophageal cancer Neg Hx   . Rectal cancer Neg Hx   . Stomach cancer Neg Hx     Social History   Socioeconomic History  . Marital status: Married    Spouse name: Stacie  . Number of children: 1  . Years of education: Not on file  . Highest education level: Not on file  Occupational History  . Occupation: [employer]  Tobacco Use  . Smoking status: Never Smoker  . Smokeless tobacco: Never Used  Vaping Use  . Vaping  Use: Never used  Substance and Sexual Activity  . Alcohol use: Yes    Alcohol/week: 3.0 standard drinks    Types: 3 Cans of beer per week  . Drug use: No  . Sexual activity: Yes    Partners: Female  Other Topics Concern  . Not on file  Social History Narrative   Occ exercise. Works for [employer].    Social Determinants of Health   Financial Resource Strain: Not on file  Food Insecurity: Not on file  Transportation Needs: Not on file  Physical Activity: Not on file  Stress: Not on file  Social Connections: Not on file  Intimate Partner Violence: Not on file    Outpatient Medications Prior to Visit  Medication Sig Dispense Refill  . amLODipine (NORVASC) 5 MG tablet TAKE 1 TABLET BY MOUTH EVERY DAY 90 tablet 1  . omeprazole (PRILOSEC) 20 MG capsule TAKE 1 CAPSULE BY MOUTH TWICE A DAY 180 capsule 2  . dicyclomine (BENTYL) 10 MG capsule Take 1 capsule (10 mg total) by mouth 2 (two) times daily. Call [phone] to schedule an office visit for more refills 180 capsule 0   No facility-administered medications prior to visit.    Allergies  Allergen Reactions  .  Iodine     Had a reaction to IV dye with skin rash  . Losartan Cough    Cough  . Lisinopril     REACTION: cough Other reaction(s): Cough REACTION: cough    Review of Systems     Objective:    Physical Exam Constitutional:      Appearance: He is well-developed and well-nourished.  HENT:     Head: Normocephalic and atraumatic.  Neck:     Comments: No carotid bruits. Cardiovascular:     Rate and Rhythm: Normal rate and regular rhythm.     Heart sounds: Normal heart sounds.     Comments: Nontender chest wall. Pulmonary:     Effort: Pulmonary effort is normal.     Breath sounds: Normal breath sounds.  Musculoskeletal:     Cervical back: Neck supple. No rigidity.  Skin:    General: Skin is warm and dry.  Neurological:     Mental Status: He is alert and oriented to person, place, and time.   Psychiatric:        Mood and Affect: Mood and affect normal.        Behavior: Behavior normal.     BP 139/86   Pulse 78   Ht 5\' 11"  (1.803 m)   Wt 196 lb (88.9 kg)   SpO2 99%   BMI 27.34 kg/m  Wt Readings from Last 3 Encounters:  11/26/20 196 lb (88.9 kg)  10/15/20 195 lb (88.5 kg)  04/16/20 193 lb (87.5 kg)    Health Maintenance Due  Topic Date Due  . COLONOSCOPY (Pts 45-54yrs Insurance coverage will need to be confirmed)  Never done    There are no preventive care reminders to display for this patient.   Lab Results  Component Value Date   TSH 7.07 (H) 10/15/2020   Lab Results  Component Value Date   WBC 7.5 10/15/2020   HGB 16.3 10/15/2020   HCT 46.5 10/15/2020   MCV 85.3 10/15/2020   PLT 272 10/15/2020   Lab Results  Component Value Date   NA 142 10/15/2020   K 4.2 10/15/2020   CO2 25 10/15/2020   GLUCOSE 84 10/15/2020   BUN 17 10/15/2020   CREATININE 1.18 10/15/2020   BILITOT 0.8 10/15/2020   ALKPHOS 57 12/03/2019   AST 17 10/15/2020   ALT 22 10/15/2020   PROT 7.1 10/15/2020   ALBUMIN 5.0 12/03/2019   CALCIUM 10.1 10/15/2020   GFR 65.42 12/03/2019   Lab Results  Component Value Date   CHOL 235 (H) 10/15/2020   Lab Results  Component Value Date   HDL 45 10/15/2020   Lab Results  Component Value Date   LDLCALC 140 (H) 10/15/2020   Lab Results  Component Value Date   TRIG 324 (H) 10/15/2020   Lab Results  Component Value Date   CHOLHDL 5.2 (H) 10/15/2020   No results found for: HGBA1C     Assessment & Plan:   Problem List Items Addressed This Visit   None   Visit Diagnoses    Atypical chest pain    -  Primary   Relevant Orders   EKG 12-Lead   CBC   COMPLETE METABOLIC PANEL WITH GFR   Lipid panel   TSH + free T4   Thyroid peroxidase antibody   DG Chest 2 View (Completed)   Elevated BP without diagnosis of hypertension       Ankle edema, bilateral         Atypical chest  pain-EKG is reassuring.  Rate of 84 bpm, normal  sinus rhythm with no acute ST-T wave changes.  We will get some additional labs just to rule out proximal other causes.  If everything is normal including chest x-ray then consider further work-up with treadmill stress test.  Ankle edema towards the end of the day-blood pressure is a little elevated today which could be contributing.  But could be a sign of other symptoms so again we will do additional work-up today.  Elevated blood pressure-plan to recheck in 2 weeks.  No orders of the defined types were placed in this encounter.    Beatrice Lecher, MD

## 2020-11-26 NOTE — Telephone Encounter (Signed)
Robert Morris reports right sided chest pain with some relief after taking Tylenol. Denies shortness of breath, dizziness or arm pain. Patient scheduled for today.

## 2020-11-27 ENCOUNTER — Other Ambulatory Visit: Payer: Self-pay | Admitting: *Deleted

## 2020-11-27 DIAGNOSIS — E781 Pure hyperglyceridemia: Secondary | ICD-10-CM

## 2020-11-27 LAB — THYROID PEROXIDASE ANTIBODY: Thyroperoxidase Ab SerPl-aCnc: 171 IU/mL — ABNORMAL HIGH (ref <9)

## 2020-11-27 LAB — CBC
HCT: 45.6 % (ref 38.5–50.0)
Hemoglobin: 16 g/dL (ref 13.2–17.1)
MCH: 30.1 pg (ref 27.0–33.0)
MCHC: 35.1 g/dL (ref 32.0–36.0)
MCV: 85.9 fL (ref 80.0–100.0)
MPV: 9.6 fL (ref 7.5–12.5)
Platelets: 304 10*3/uL (ref 140–400)
RBC: 5.31 10*6/uL (ref 4.20–5.80)
RDW: 13.1 % (ref 11.0–15.0)
WBC: 8.4 10*3/uL (ref 3.8–10.8)

## 2020-11-27 LAB — COMPLETE METABOLIC PANEL WITH GFR
AG Ratio: 2 (calc) (ref 1.0–2.5)
ALT: 25 U/L (ref 9–46)
AST: 19 U/L (ref 10–40)
Albumin: 4.8 g/dL (ref 3.6–5.1)
Alkaline phosphatase (APISO): 54 U/L (ref 36–130)
BUN: 16 mg/dL (ref 7–25)
CO2: 30 mmol/L (ref 20–32)
Calcium: 10 mg/dL (ref 8.6–10.3)
Chloride: 102 mmol/L (ref 98–110)
Creat: 1.18 mg/dL (ref 0.60–1.35)
GFR, Est African American: 85 mL/min/{1.73_m2} (ref >=60)
GFR, Est Non African American: 74 mL/min/{1.73_m2} (ref >=60)
Globulin: 2.4 g/dL (calc) (ref 1.9–3.7)
Glucose, Bld: 86 mg/dL (ref 65–139)
Potassium: 4.3 mmol/L (ref 3.5–5.3)
Sodium: 140 mmol/L (ref 135–146)
Total Bilirubin: 0.7 mg/dL (ref 0.2–1.2)
Total Protein: 7.2 g/dL (ref 6.1–8.1)

## 2020-11-27 LAB — LIPID PANEL
Cholesterol: 223 mg/dL — ABNORMAL HIGH (ref <200)
HDL: 39 mg/dL — ABNORMAL LOW (ref >=40)
Non-HDL Cholesterol (Calc): 184 mg/dL (calc) — ABNORMAL HIGH (ref <130)
Total CHOL/HDL Ratio: 5.7 (calc) — ABNORMAL HIGH (ref <5.0)
Triglycerides: 401 mg/dL — ABNORMAL HIGH (ref <150)

## 2020-11-27 LAB — TSH+FREE T4: TSH W/REFLEX TO FT4: 6.73 mIU/L — ABNORMAL HIGH (ref 0.40–4.50)

## 2020-11-27 LAB — T4, FREE: Free T4: 1.1 ng/dL (ref 0.8–1.8)

## 2020-11-27 MED ORDER — ATORVASTATIN CALCIUM 20 MG PO TABS: 20.0000 mg | ORAL_TABLET | Freq: Every day | ORAL | 3 refills | Status: DC

## 2021-01-19 ENCOUNTER — Other Ambulatory Visit: Payer: Self-pay

## 2021-01-19 ENCOUNTER — Ambulatory Visit (AMBULATORY_SURGERY_CENTER): Payer: BC Managed Care – PPO

## 2021-01-19 VITALS — Ht 71.0 in | Wt 198.0 lb

## 2021-01-19 DIAGNOSIS — Z1211 Encounter for screening for malignant neoplasm of colon: Secondary | ICD-10-CM

## 2021-01-19 MED ORDER — PLENVU 140 G PO SOLR: 1.0000 | ORAL | 0 refills | Status: DC

## 2021-01-19 NOTE — Progress Notes (Signed)
No allergies to soy or egg Pt is not on blood thinners or diet pills Denies issues with sedation/intubation Denies atrial flutter/fib Denies constipation   Emmi instructions given to pt  Pt is aware of Covid safety and care partner requirements.  

## 2021-01-20 ENCOUNTER — Encounter: Payer: Self-pay | Admitting: Gastroenterology

## 2021-02-02 ENCOUNTER — Other Ambulatory Visit: Payer: Self-pay

## 2021-02-02 ENCOUNTER — Encounter: Payer: Self-pay | Admitting: Gastroenterology

## 2021-02-02 ENCOUNTER — Ambulatory Visit (AMBULATORY_SURGERY_CENTER): Payer: BC Managed Care – PPO | Admitting: Gastroenterology

## 2021-02-02 VITALS — BP 132/80 | HR 81 | Temp 98.3°F | Resp 16 | Ht 71.0 in | Wt 198.0 lb

## 2021-02-02 DIAGNOSIS — K635 Polyp of colon: Secondary | ICD-10-CM | POA: Diagnosis not present

## 2021-02-02 DIAGNOSIS — Z1211 Encounter for screening for malignant neoplasm of colon: Secondary | ICD-10-CM

## 2021-02-02 DIAGNOSIS — D122 Benign neoplasm of ascending colon: Secondary | ICD-10-CM

## 2021-02-02 MED ORDER — SODIUM CHLORIDE 0.9 % IV SOLN: 500.0000 mL | Freq: Once | INTRAVENOUS | Status: DC

## 2021-02-02 NOTE — Progress Notes (Signed)
PT taken to PACU. Monitors in place. VSS. Report given to RN. 

## 2021-02-02 NOTE — Progress Notes (Signed)
C.W. vital signs. 

## 2021-02-02 NOTE — Patient Instructions (Signed)

## 2021-02-02 NOTE — Op Note (Signed)
[facility] Patient Name: Robert Morris Procedure Date: 02/02/2021 11:10 AM MRN: [id number] Endoscopist: Jackquline Denmark , MD Age: 45 Referring MD:  Date of Birth: [birth year] Gender: Male Account #: [id number] Procedure:                Colonoscopy Indications:              Screening for colorectal malignant neoplasm Medicines:                Monitored Anesthesia Care Procedure:                Pre-Anesthesia Assessment:                           - Prior to the procedure, a History and Physical                            was performed, and patient medications and                            allergies were reviewed. The patient's tolerance of                            previous anesthesia was also reviewed. The risks                            and benefits of the procedure and the sedation                            options and risks were discussed with the patient.                            All questions were answered, and informed consent                            was obtained. Prior Anticoagulants: The patient has                            taken no previous anticoagulant or antiplatelet                            agents. ASA Grade Assessment: II - A patient with                            mild systemic disease. After reviewing the risks                            and benefits, the patient was deemed in                            satisfactory condition to undergo the procedure.                           After obtaining informed consent, the colonoscope  was passed under direct vision. Throughout the                            procedure, the patient's blood pressure, pulse, and                            oxygen saturations were monitored continuously. The                            Colonoscope was introduced through the anus and                            advanced to the 2 cm into the ileum. The                            colonoscopy was performed  without difficulty. The                            patient tolerated the procedure well. The quality                            of the bowel preparation was good. The terminal                            ileum, ileocecal valve, appendiceal orifice, and                            rectum were photographed. Scope In: 11:13:36 AM Scope Out: 11:26:33 AM Scope Withdrawal Time: 0 hours 9 minutes 58 seconds  Total Procedure Duration: 0 hours 12 minutes 57 seconds  Findings:                 A 4 mm polyp was found in the proximal descending                            colon. The polyp was sessile. The polyp was removed                            with a cold snare. Resection and retrieval were                            complete.                           A few small-mouthed diverticula were found in the                            sigmoid colon.                           Non-bleeding internal hemorrhoids were found during                            retroflexion. The hemorrhoids were small.  The terminal ileum appeared normal.                           The exam was otherwise without abnormality on                            direct and retroflexion views. Complications:            No immediate complications. Estimated Blood Loss:     Estimated blood loss: none. Impression:               - One 4 mm polyp in the proximal descending colon,                            removed with a cold snare. Resected and retrieved.                           - Mild sigmoid diverticulosis.                           - The examined portion of the ileum was normal.                           - The examination was otherwise normal on direct                            and retroflexion views. Recommendation:           - Patient has a contact number available for                            emergencies. The signs and symptoms of potential                            delayed complications were discussed with  the                            patient. Return to normal activities tomorrow.                            Written discharge instructions were provided to the                            patient.                           - Resume previous diet.                           - Continue present medications.                           - Await pathology results.                           - Repeat colonoscopy for surveillance based on  pathology results.                           - The findings and recommendations were discussed                            with the patient's family. Jackquline Denmark, MD 02/02/2021 11:30:07 AM This report has been signed electronically.

## 2021-02-02 NOTE — Progress Notes (Signed)
Pt's states no medical or surgical changes since previsit or office visit. 

## 2021-02-04 ENCOUNTER — Telehealth: Payer: Self-pay

## 2021-02-04 NOTE — Telephone Encounter (Signed)
  Follow up Call-  Call back number 02/02/2021 11/27/2019  Post procedure Call Back phone  # [phone]  Permission to leave phone message Yes Yes  Some recent data might be hidden     Patient questions:  Do you have a fever, pain , or abdominal swelling? No. Pain Score  0 *  Have you tolerated food without any problems? Yes.    Have you been able to return to your normal activities? Yes.    Do you have any questions about your discharge instructions: Diet   No. Medications  No. Follow up visit  No.  Do you have questions or concerns about your Care? No.  Actions: * If pain score is 4 or above: No action needed, pain <4. 1. Have you developed a fever since your procedure? no  2.   Have you had an respiratory symptoms (SOB or cough) since your procedure? no  3.   Have you tested positive for COVID 19 since your procedure no  4.   Have you had any family members/close contacts diagnosed with the COVID 19 since your procedure?  no   If yes to any of these questions please route to Joylene John, RN and Joella Prince, RN

## 2021-02-08 ENCOUNTER — Encounter: Payer: Self-pay | Admitting: Gastroenterology

## 2021-02-15 ENCOUNTER — Other Ambulatory Visit: Payer: Self-pay | Admitting: Family Medicine

## 2021-02-15 DIAGNOSIS — I10 Essential (primary) hypertension: Secondary | ICD-10-CM

## 2021-04-15 ENCOUNTER — Ambulatory Visit: Payer: BC Managed Care – PPO | Admitting: Family Medicine

## 2021-04-15 ENCOUNTER — Other Ambulatory Visit: Payer: Self-pay

## 2021-04-15 ENCOUNTER — Encounter: Payer: Self-pay | Admitting: Family Medicine

## 2021-04-15 VITALS — BP 134/78 | HR 84 | Ht 71.0 in | Wt 196.0 lb

## 2021-04-15 DIAGNOSIS — E038 Other specified hypothyroidism: Secondary | ICD-10-CM

## 2021-04-15 DIAGNOSIS — I10 Essential (primary) hypertension: Secondary | ICD-10-CM | POA: Diagnosis not present

## 2021-04-15 DIAGNOSIS — E781 Pure hyperglyceridemia: Secondary | ICD-10-CM | POA: Diagnosis not present

## 2021-04-15 MED ORDER — AMLODIPINE BESYLATE 10 MG PO TABS: 10.0000 mg | ORAL_TABLET | Freq: Every day | ORAL | 0 refills | Status: DC

## 2021-04-15 NOTE — Assessment & Plan Note (Signed)
Recheck lipids and liver enzymes on statin.  Has been tolerating it well without any side effects or problems.

## 2021-04-15 NOTE — Progress Notes (Signed)
Established Patient Office Visit  Subjective:  Patient ID: Robert Morris, male    DOB: 07-03-1976  Age: 45 y.o. MRN: [id number]  CC:  Chief Complaint  Patient presents with  . Hypertension    HPI Robert Morris presents for   Hypertension- Pt denies chest pain, SOB, dizziness, or heart palpitations.  Taking meds as directed w/o problems.  Denies medication side effects.    F/U hypertriglyceridemia- started fish oil and statin in Jan secondary to elevated triglycerides greater than 400.  He does report that he would actually like to get down to about 180 pounds.  He knows he needs to lose about 10 pounds.  Elevated thyroid peroxidase - started selenium. In Jan   Past Medical History:  Diagnosis Date  . Anxiety   . GERD (gastroesophageal reflux disease)   . GERD (gastroesophageal reflux disease)   . Hyperlipidemia   . Hypertension   . Pityriasis rosea   . Thyroid disease     Past Surgical History:  Procedure Laterality Date  . ESOPHAGOGASTRODUODENOSCOPY  2018  . SHOULDER SURGERY  1998   Left, repair of labrum  . UMBILICAL HERNIA REPAIR  1996  . UPPER GASTROINTESTINAL ENDOSCOPY      Family History  Problem Relation Age of Onset  . Hyperlipidemia Father   . Prostate cancer Father   . Bipolar disorder Brother   . Asthma Mother   . Cirrhosis Mother   . Colon polyps Mother   . Cirrhosis Maternal Grandmother   . Colon cancer Neg Hx   . Esophageal cancer Neg Hx   . Rectal cancer Neg Hx   . Stomach cancer Neg Hx     Social History   Socioeconomic History  . Marital status: Married    Spouse name: Robert Morris  . Number of children: 1  . Years of education: Not on file  . Highest education level: Not on file  Occupational History  . Occupation: [employer]  Tobacco Use  . Smoking status: Never Smoker  . Smokeless tobacco: Never Used  Vaping Use  . Vaping Use: Never used  Substance and Sexual Activity  . Alcohol use: Yes    Alcohol/week: 3.0  standard drinks    Types: 3 Cans of beer per week  . Drug use: No  . Sexual activity: Yes    Partners: Female  Other Topics Concern  . Not on file  Social History Narrative   Occ exercise. Works for [employer].    Social Determinants of Health   Financial Resource Strain: Not on file  Food Insecurity: Not on file  Transportation Needs: Not on file  Physical Activity: Not on file  Stress: Not on file  Social Connections: Not on file  Intimate Partner Violence: Not on file    Outpatient Medications Prior to Visit  Medication Sig Dispense Refill  . atorvastatin (LIPITOR) 20 MG tablet Take 1 tablet (20 mg total) by mouth at bedtime. 90 tablet 3  . Fexofenadine HCl (ALLEGRA PO) Take by mouth.    Marland Kitchen omeprazole (PRILOSEC) 20 MG capsule TAKE 1 CAPSULE BY MOUTH TWICE A DAY 180 capsule 2  . amLODipine (NORVASC) 5 MG tablet TAKE 1 TABLET BY MOUTH EVERY DAY 90 tablet 1   No facility-administered medications prior to visit.    Allergies  Allergen Reactions  . Iodine     Had a reaction to IV dye with skin rash  . Losartan Cough    Cough  . Lisinopril  REACTION: cough Other reaction(s): Cough REACTION: cough    ROS Review of Systems    Objective:    Physical Exam Constitutional:      Appearance: He is well-developed.  HENT:     Head: Normocephalic and atraumatic.  Cardiovascular:     Rate and Rhythm: Normal rate and regular rhythm.     Heart sounds: Normal heart sounds.  Pulmonary:     Effort: Pulmonary effort is normal.     Breath sounds: Normal breath sounds.  Skin:    General: Skin is warm and dry.  Neurological:     Mental Status: He is alert and oriented to person, place, and time.  Psychiatric:        Behavior: Behavior normal.     BP 134/78   Pulse 84   Ht 5\' 11"  (1.803 m)   Wt 196 lb (88.9 kg)   SpO2 98%   BMI 27.34 kg/m  Wt Readings from Last 3 Encounters:  04/15/21 196 lb (88.9 kg)  02/02/21 198 lb (89.8 kg)  01/19/21 198 lb (89.8  kg)     There are no preventive care reminders to display for this patient.  There are no preventive care reminders to display for this patient.  Lab Results  Component Value Date   TSH 7.07 (H) 10/15/2020   Lab Results  Component Value Date   WBC 8.4 11/26/2020   HGB 16.0 11/26/2020   HCT 45.6 11/26/2020   MCV 85.9 11/26/2020   PLT 304 11/26/2020   Lab Results  Component Value Date   NA 140 11/26/2020   K 4.3 11/26/2020   CO2 30 11/26/2020   GLUCOSE 86 11/26/2020   BUN 16 11/26/2020   CREATININE 1.18 11/26/2020   BILITOT 0.7 11/26/2020   ALKPHOS 57 12/03/2019   AST 19 11/26/2020   ALT 25 11/26/2020   PROT 7.2 11/26/2020   ALBUMIN 5.0 12/03/2019   CALCIUM 10.0 11/26/2020   GFR 65.42 12/03/2019   Lab Results  Component Value Date   CHOL 223 (H) 11/26/2020   Lab Results  Component Value Date   HDL 39 (L) 11/26/2020   Lab Results  Component Value Date   [facility]  11/26/2020     Comment:     . LDL cholesterol not calculated. Triglyceride levels greater than 400 mg/dL invalidate calculated LDL results. . Reference range: <100 . Desirable range <100 mg/dL for primary prevention;   <70 mg/dL for patients with CHD or diabetic patients  with > or = 2 CHD risk factors. Marland Kitchen LDL-C is now calculated using the Martin-Hopkins  calculation, which is a validated novel method providing  better accuracy than the Friedewald equation in the  estimation of LDL-C.  Cresenciano Genre et al. Annamaria Helling. 3762;831(51): 2061-2068  (http://education.[employer].com/faq/FAQ164)    Lab Results  Component Value Date   TRIG 401 (H) 11/26/2020   Lab Results  Component Value Date   CHOLHDL 5.7 (H) 11/26/2020   No results found for: HGBA1C    Assessment & Plan:   Problem List Items Addressed This Visit      Cardiovascular and Mediastinum   Essential hypertension - Primary    Pressures are still borderline we discussed going up on the amlodipine but monitoring for lower extremity  swelling which she does notice occasionally already.  Continue to work on healthy diet regular exercise, weight loss and low-sodium diet.  Recommend less than 1500 mg of sodium daily.  Follow-up in 6 months or sooner if he is having any  problem.  We also discussed the possibility of trial of 2.5 mg HCTZ if the higher dose of amlodipine caused side effects.      Relevant Medications   amLODipine (NORVASC) 10 MG tablet   Other Relevant Orders   Lipid Panel w/reflex Direct LDL   COMPLETE METABOLIC PANEL WITH GFR     Endocrine   Subclinical hypothyroidism    Recheck levels now that he has been on selenium for about 5 months.  Like to see if his thyroid peroxidase antibody levels decrease.      Relevant Orders   Thyroid peroxidase antibody   TSH + free T4     Other   HYPERTRIGLYCERIDEMIA    Recheck lipids and liver enzymes on statin.  Has been tolerating it well without any side effects or problems.      Relevant Medications   amLODipine (NORVASC) 10 MG tablet   Other Relevant Orders   Lipid Panel w/reflex Direct LDL      Meds ordered this encounter  Medications  . amLODipine (NORVASC) 10 MG tablet    Sig: Take 1 tablet (10 mg total) by mouth daily.    Dispense:  90 tablet    Refill:  0    Follow-up: Return in about 6 months (around 10/15/2021) for Hypertension.    Beatrice Lecher, MD

## 2021-04-15 NOTE — Assessment & Plan Note (Signed)
Pressures are still borderline we discussed going up on the amlodipine but monitoring for lower extremity swelling which she does notice occasionally already.  Continue to work on healthy diet regular exercise, weight loss and low-sodium diet.  Recommend less than 1500 mg of sodium daily.  Follow-up in 6 months or sooner if he is having any problem.  We also discussed the possibility of trial of 2.5 mg HCTZ if the higher dose of amlodipine caused side effects.

## 2021-04-15 NOTE — Assessment & Plan Note (Signed)
Recheck levels now that he has been on selenium for about 5 months.  Like to see if his thyroid peroxidase antibody levels decrease.

## 2021-04-16 ENCOUNTER — Encounter: Payer: Self-pay | Admitting: Family Medicine

## 2021-04-16 LAB — LIPID PANEL W/REFLEX DIRECT LDL
Cholesterol: 164 mg/dL (ref <200)
HDL: 41 mg/dL (ref >=40)
LDL Cholesterol (Calc): 92 mg/dL (calc)
Non-HDL Cholesterol (Calc): 123 mg/dL (calc) (ref <130)
Total CHOL/HDL Ratio: 4 (calc) (ref <5.0)
Triglycerides: 216 mg/dL — ABNORMAL HIGH (ref <150)

## 2021-04-16 LAB — COMPLETE METABOLIC PANEL WITH GFR
AG Ratio: 2.2 (calc) (ref 1.0–2.5)
ALT: 25 U/L (ref 9–46)
AST: 17 U/L (ref 10–40)
Albumin: 4.8 g/dL (ref 3.6–5.1)
Alkaline phosphatase (APISO): 61 U/L (ref 36–130)
BUN: 12 mg/dL (ref 7–25)
CO2: 26 mmol/L (ref 20–32)
Calcium: 9.8 mg/dL (ref 8.6–10.3)
Chloride: 104 mmol/L (ref 98–110)
Creat: 1.25 mg/dL (ref 0.60–1.35)
GFR, Est African American: 80 mL/min/{1.73_m2} (ref >=60)
GFR, Est Non African American: 69 mL/min/{1.73_m2} (ref >=60)
Globulin: 2.2 g/dL (calc) (ref 1.9–3.7)
Glucose, Bld: 86 mg/dL (ref 65–99)
Potassium: 4.3 mmol/L (ref 3.5–5.3)
Sodium: 141 mmol/L (ref 135–146)
Total Bilirubin: 0.7 mg/dL (ref 0.2–1.2)
Total Protein: 7 g/dL (ref 6.1–8.1)

## 2021-04-16 LAB — T4, FREE: Free T4: 1.2 ng/dL (ref 0.8–1.8)

## 2021-04-16 LAB — THYROID PEROXIDASE ANTIBODY: Thyroperoxidase Ab SerPl-aCnc: 151 IU/mL — ABNORMAL HIGH (ref <9)

## 2021-04-16 LAB — TSH+FREE T4: TSH W/REFLEX TO FT4: 7.43 mIU/L — ABNORMAL HIGH (ref 0.40–4.50)

## 2021-04-19 MED ORDER — LEVOTHYROXINE SODIUM 25 MCG PO TABS: 25.0000 ug | ORAL_TABLET | Freq: Every day | ORAL | 1 refills | Status: DC

## 2021-04-19 NOTE — Addendum Note (Signed)
Addended by: Beatrice Lecher D on: 04/19/2021 04:55 PM   Modules accepted: Orders

## 2021-05-13 ENCOUNTER — Other Ambulatory Visit: Payer: Self-pay | Admitting: Family Medicine

## 2021-06-24 ENCOUNTER — Other Ambulatory Visit: Payer: Self-pay | Admitting: Family Medicine

## 2021-07-08 ENCOUNTER — Other Ambulatory Visit: Payer: Self-pay | Admitting: Family Medicine

## 2021-07-16 ENCOUNTER — Other Ambulatory Visit: Payer: Self-pay | Admitting: Family Medicine

## 2021-07-16 DIAGNOSIS — I10 Essential (primary) hypertension: Secondary | ICD-10-CM

## 2021-07-21 ENCOUNTER — Encounter: Payer: Self-pay | Admitting: Family Medicine

## 2021-07-21 LAB — LIPID PANEL
Cholesterol: 133 (ref 0–200)
HDL: 40 (ref 35–70)
LDL Cholesterol: 66
LDl/HDL Ratio: 0.6
Triglycerides: 135 (ref 40–160)

## 2021-07-21 LAB — BASIC METABOLIC PANEL: Glucose: 60

## 2021-07-21 LAB — HEMOGLOBIN A1C: Hemoglobin A1C: 4.7

## 2021-10-18 ENCOUNTER — Ambulatory Visit: Payer: BC Managed Care – PPO | Admitting: Family Medicine

## 2021-11-25 ENCOUNTER — Emergency Department
Admission: RE | Admit: 2021-11-25 | Discharge: 2021-11-25 | Disposition: A | Discharge status: Home / Self Care | Payer: BC Managed Care – PPO | Source: Ambulatory Visit | Attending: Family Medicine | Admitting: Family Medicine

## 2021-11-25 ENCOUNTER — Other Ambulatory Visit: Payer: Self-pay

## 2021-11-25 ENCOUNTER — Emergency Department (INDEPENDENT_AMBULATORY_CARE_PROVIDER_SITE_OTHER): Payer: BC Managed Care – PPO

## 2021-11-25 VITALS — BP 152/94 | HR 82 | Temp 98.3°F | Resp 17

## 2021-11-25 DIAGNOSIS — M25361 Other instability, right knee: Secondary | ICD-10-CM

## 2021-11-25 DIAGNOSIS — M25561 Pain in right knee: Secondary | ICD-10-CM | POA: Diagnosis not present

## 2021-11-25 DIAGNOSIS — M25461 Effusion, right knee: Secondary | ICD-10-CM

## 2021-11-25 DIAGNOSIS — R52 Pain, unspecified: Secondary | ICD-10-CM

## 2021-11-25 MED ORDER — METHYLPREDNISOLONE 4 MG PO TBPK: ORAL_TABLET | ORAL | 0 refills | Status: DC

## 2021-11-25 NOTE — Discharge Instructions (Signed)
Put ice on your knee for 20 minutes 3-4 times a day Limit walking while knee is painful Take the prednisone as directed.  Take all of day 1 today After you have finished the prednisone you may go back on Aleve 2 pills in the morning and 2 pills at night as needed for pain, swelling I suspect there may be some internal damage within the knee.  I recommend you follow-up with Dr. Dianah Field if problems persist

## 2021-11-25 NOTE — ED Provider Notes (Signed)
[facility]    CSN: [id number] Arrival date & time: 11/25/21  1246      History   Chief Complaint Chief Complaint  Patient presents with   Knee Pain    RT, APPT 1pm    HPI Robert Morris is a 46 y.o. male.   HPI  Patient is a healthy 46 year old gentleman.  Compliant with his medical care.  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  He did not have any specific injury to his knee at that time.  For the last couple of years he has had occasional clicking sensation in his knee and occasional sensation that the knee will give out.  It has been brief and did not require treatment.  Yesterday, however, he was walking on even ground and he had a sudden pain in his knee, felt like it was going to buckle although he did not fall.  Today when he woke up the knee was worse and he can really hardly bear weight.  He has swelling.  Past Medical History:  Diagnosis Date   Anxiety    GERD (gastroesophageal reflux disease)    GERD (gastroesophageal reflux disease)    Hyperlipidemia    Hypertension    Pityriasis rosea    Thyroid disease     Patient Active Problem List   Diagnosis Date Noted   Subclinical hypothyroidism 10/20/2020   Anxiety with flying 04/16/2020   Snoring 10/23/2019   Acute midline low back pain without sciatica 03/12/2019   Gastroesophageal reflux disease with esophagitis 03/12/2019   Family history of prostate cancer in father 08/10/2018   Family history of hepatic cirrhosis 08/10/2018   DDD (degenerative disc disease), cervical 08/30/2016   Essential hypertension 03/08/2016   Other seasonal allergic rhinitis 12/11/2015   Infertility male 12/06/2011   HYPERTRIGLYCERIDEMIA 10/29/2008    Past Surgical History:  Procedure Laterality Date   ESOPHAGOGASTRODUODENOSCOPY  2018   SHOULDER SURGERY  1998   Left, repair of labrum   UMBILICAL HERNIA REPAIR  1996   UPPER GASTROINTESTINAL ENDOSCOPY         Home Medications    Prior to  Admission medications   Medication Sig Start Date End Date Taking? Authorizing Provider  methylPREDNISolone (MEDROL DOSEPAK) 4 MG TBPK tablet tad 11/25/21  Yes Raylene Everts, MD  amLODipine (NORVASC) 10 MG tablet TAKE 1 TABLET BY MOUTH EVERY DAY 07/16/21   Hali Marry, MD  atorvastatin (LIPITOR) 20 MG tablet Take 1 tablet (20 mg total) by mouth at bedtime. 11/27/20   Hali Marry, MD  Fexofenadine HCl (ALLEGRA PO) Take by mouth.    [provider]  levothyroxine (SYNTHROID) 25 MCG tablet TAKE 1 TABLET BY MOUTH EVERY DAY 06/24/21   Hali Marry, MD  omeprazole (PRILOSEC) 20 MG capsule TAKE 1 CAPSULE BY MOUTH TWICE A DAY 11/20/20   Cirigliano, Dominic Pea, DO    Family History Family History  Problem Relation Age of Onset   Hyperlipidemia Father    Prostate cancer Father    Bipolar disorder Brother    Asthma Mother    Cirrhosis Mother    Colon polyps Mother    Cirrhosis Maternal Grandmother    Colon cancer Neg Hx    Esophageal cancer Neg Hx    Rectal cancer Neg Hx    Stomach cancer Neg Hx     Social History Social History   Tobacco Use   Smoking status: Never   Smokeless tobacco: Never  Vaping  Use   Vaping Use: Never used  Substance Use Topics   Alcohol use: Yes    Alcohol/week: 3.0 standard drinks    Types: 3 Cans of beer per week   Drug use: No     Allergies   Iodine, Losartan, and Lisinopril   Review of Systems Review of Systems See HPI  Physical Exam Triage Vital Signs ED Triage Vitals  Enc Vitals Group     BP 11/25/21 1258 (!) 161/104     Pulse Rate 11/25/21 1258 100     Resp 11/25/21 1258 17     Temp 11/25/21 1258 98.3 F (36.8 C)     Temp Source 11/25/21 1258 Oral     SpO2 11/25/21 1258 98 %     Weight --      Height --      Head Circumference --      Peak Flow --      Pain Score 11/25/21 1257 4     Pain Loc --      Pain Edu? --      Excl. in [place]? --    No data found.  Updated Vital Signs BP (!) 152/94 (BP  Location: Left Arm)    Pulse 82    Temp 98.3 F (36.8 C) (Oral)    Resp 17    SpO2 98%      Physical Exam Constitutional:      General: He is not in acute distress.    Appearance: He is well-developed and normal weight.  HENT:     Head: Normocephalic and atraumatic.     Mouth/Throat:     Comments: Mask is in place Eyes:     Conjunctiva/sclera: Conjunctivae normal.     Pupils: Pupils are equal, round, and reactive to light.  Cardiovascular:     Rate and Rhythm: Normal rate.  Pulmonary:     Effort: Pulmonary effort is normal. No respiratory distress.  Abdominal:     General: There is no distension.     Palpations: Abdomen is soft.  Musculoskeletal:        General: Normal range of motion.     Cervical back: Normal range of motion.     Comments: Warmth present on right knee.  Effusion.  Limited range of motion both at the end of extension and flexion.  No instability.  Tenderness acutely in the anterior portion of the medial joint line.  No tenderness over the ligament structures  Skin:    General: Skin is warm and dry.  Neurological:     Mental Status: He is alert.     Gait: Gait abnormal.  Psychiatric:        Mood and Affect: Mood normal.        Behavior: Behavior normal.     UC Treatments / Results  Labs (all labs ordered are listed, but only abnormal results are displayed) Labs Reviewed - No data to display  EKG   Radiology DG Knee Complete 4 Views Right  Result Date: 11/25/2021 CLINICAL DATA:  pain medial joint EXAM: RIGHT KNEE - COMPLETE 4+ VIEW COMPARISON:  None. FINDINGS: Normal alignment. No acute fracture. No significant degenerative changes. Normal mineralization. The soft tissues are unremarkable. No joint effusion. IMPRESSION: Normal knee radiograph.  No acute osseous abnormality. Electronically Signed   By: Albin Felling M.D.   On: 11/25/2021 14:22    Procedures Procedures (including critical care time)  Medications Ordered in UC Medications - No data  to display  Initial  Impression / Assessment and Plan / UC Course  I have reviewed the triage vital signs and the nursing notes.  Pertinent labs & imaging results that were available during my care of the patient were reviewed by me and considered in my medical decision making (see chart for details).     X-rays are normal Symptoms and physical examinations suggest internal derangement Suspect medial meniscus damage Final Clinical Impressions(s) / UC Diagnoses   Final diagnoses:  Acute pain of right knee  Effusion of right knee  Knee instability, right     Discharge Instructions      Put ice on your knee for 20 minutes 3-4 times a day Limit walking while knee is painful Take the prednisone as directed.  Take all of day 1 today After you have finished the prednisone you may go back on Aleve 2 pills in the morning and 2 pills at night as needed for pain, swelling I suspect there may be some internal damage within the knee.  I recommend you follow-up with Dr. Dianah Field if problems persist     ED Prescriptions     Medication Sig Dispense Auth. Provider   methylPREDNISolone (MEDROL DOSEPAK) 4 MG TBPK tablet tad 21 tablet Raylene Everts, MD      PDMP not reviewed this encounter.   Raylene Everts, MD 11/25/21 [phone]

## 2021-11-25 NOTE — ED Triage Notes (Addendum)
Pt c/o RT knee pain since yesterday when he felt his knee pop while walking back to truck. Immediate pain after. Mild swelling. Has had previous bouts of pain in past that resolve on its own. Pain 4/10 Ice and elevated prn. Aleve this am.   Pt is hypertensive today. States he has white coat syndrome.

## 2021-12-01 ENCOUNTER — Encounter: Payer: Self-pay | Admitting: Gastroenterology

## 2021-12-09 ENCOUNTER — Other Ambulatory Visit: Payer: Self-pay | Admitting: Family Medicine

## 2021-12-13 ENCOUNTER — Other Ambulatory Visit: Payer: Self-pay

## 2021-12-13 DIAGNOSIS — K219 Gastro-esophageal reflux disease without esophagitis: Secondary | ICD-10-CM

## 2021-12-13 MED ORDER — OMEPRAZOLE 20 MG PO CPDR: 20.0000 mg | DELAYED_RELEASE_CAPSULE | Freq: Two times a day (BID) | ORAL | 1 refills | Status: DC

## 2021-12-13 NOTE — Telephone Encounter (Signed)
Please increase omeprazole to 20 mg p.o. twice daily for now RG

## 2021-12-16 ENCOUNTER — Encounter: Payer: Self-pay | Admitting: Family Medicine

## 2021-12-16 ENCOUNTER — Other Ambulatory Visit: Payer: Self-pay

## 2021-12-16 ENCOUNTER — Ambulatory Visit: Payer: BC Managed Care – PPO | Admitting: Family Medicine

## 2021-12-16 VITALS — BP 147/85 | HR 92 | Resp 16 | Ht 71.0 in | Wt 192.0 lb

## 2021-12-16 DIAGNOSIS — E781 Pure hyperglyceridemia: Secondary | ICD-10-CM | POA: Diagnosis not present

## 2021-12-16 DIAGNOSIS — M25561 Pain in right knee: Secondary | ICD-10-CM

## 2021-12-16 DIAGNOSIS — J069 Acute upper respiratory infection, unspecified: Secondary | ICD-10-CM | POA: Diagnosis not present

## 2021-12-16 DIAGNOSIS — I10 Essential (primary) hypertension: Secondary | ICD-10-CM

## 2021-12-16 LAB — LIPID PANEL W/REFLEX DIRECT LDL
Cholesterol: 148 mg/dL (ref <200)
HDL: 35 mg/dL — ABNORMAL LOW (ref >=40)
LDL Cholesterol (Calc): 79 mg/dL (calc)
Non-HDL Cholesterol (Calc): 113 mg/dL (calc) (ref <130)
Total CHOL/HDL Ratio: 4.2 (calc) (ref <5.0)
Triglycerides: 260 mg/dL — ABNORMAL HIGH (ref <150)

## 2021-12-16 LAB — CBC
HCT: 44.9 % (ref 38.5–50.0)
Hemoglobin: 15.3 g/dL (ref 13.2–17.1)
MCH: 29.5 pg (ref 27.0–33.0)
MCHC: 34.1 g/dL (ref 32.0–36.0)
MCV: 86.5 fL (ref 80.0–100.0)
MPV: 9.6 fL (ref 7.5–12.5)
Platelets: 290 10*3/uL (ref 140–400)
RBC: 5.19 10*6/uL (ref 4.20–5.80)
RDW: 13.3 % (ref 11.0–15.0)
WBC: 10.6 10*3/uL (ref 3.8–10.8)

## 2021-12-16 LAB — COMPLETE METABOLIC PANEL WITH GFR
AG Ratio: 1.9 (calc) (ref 1.0–2.5)
ALT: 29 U/L (ref 9–46)
AST: 17 U/L (ref 10–40)
Albumin: 4.8 g/dL (ref 3.6–5.1)
Alkaline phosphatase (APISO): 65 U/L (ref 36–130)
BUN: 14 mg/dL (ref 7–25)
CO2: 29 mmol/L (ref 20–32)
Calcium: 9.5 mg/dL (ref 8.6–10.3)
Chloride: 102 mmol/L (ref 98–110)
Creat: 1.05 mg/dL (ref 0.60–1.29)
Globulin: 2.5 g/dL (calc) (ref 1.9–3.7)
Glucose, Bld: 89 mg/dL (ref 65–99)
Potassium: 4.6 mmol/L (ref 3.5–5.3)
Sodium: 139 mmol/L (ref 135–146)
Total Bilirubin: 0.6 mg/dL (ref 0.2–1.2)
Total Protein: 7.3 g/dL (ref 6.1–8.1)
eGFR: 88 mL/min/{1.73_m2} (ref >=60)

## 2021-12-16 MED ORDER — HYDROCHLOROTHIAZIDE 12.5 MG PO CAPS: 12.5000 mg | ORAL_CAPSULE | Freq: Every day | ORAL | 0 refills | Status: DC

## 2021-12-16 MED ORDER — AMLODIPINE BESYLATE 10 MG PO TABS: 10.0000 mg | ORAL_TABLET | Freq: Every day | ORAL | 1 refills | Status: DC

## 2021-12-16 NOTE — Assessment & Plan Note (Addendum)
Pressure not well controlled here and when I look back he typically runs in the 130s he says he does get some 120s at home.  I think him get a make a slight change to his regimen today and we will have him follow-up in about 3 to 4 weeks.  He does not tolerate ACEs or arms so we will add 12.5 mg of HCTZ.

## 2021-12-16 NOTE — Assessment & Plan Note (Signed)
Due to recheck lipids.  Continue atorvastatin.

## 2021-12-16 NOTE — Progress Notes (Signed)
Established Patient Office Visit  Subjective:  Patient ID: Robert Morris, male    DOB: May 26, 1976  Age: 46 y.o. MRN: [id number]  CC:  Chief Complaint  Patient presents with   Hypertension    Follow up    Cough    Productive cough, nasal/chest congestion, 2 weeks    HPI Robert Morris presents for   Hypertension- Pt denies chest pain, SOB, dizziness, or heart palpitations.  Taking meds as directed w/o problems.  Denies medication side effects.    Hyperlipidemia - tolerating stating well with no myalgias or significant side effects.  Lab Results  Component Value Date   CHOL 133 07/21/2021   HDL 40 07/21/2021   LDLCALC 66 07/21/2021   TRIG 135 07/21/2021   CHOLHDL 4.0 04/15/2021    2 weeks cough and nasal congestion.started right after he had a stomach bug that he got from his son.  Its been going on for a little bit more than a week.  He does feel just a little bit better today.  No fevers or chills he says not really slowing him down he still just has a lot of mucus and drainage in his sinuses and upper chest area.  He was also seen in the urgent care for right knee pain he said he was walking across the lawn and suddenly felt a very sharp painful pop in his right knee.  The next day he says it was mildly swollen and was really difficult to walk on his about 4 days later it started to get a little bit better but he is noticed that certain activities like bending for a while and then trying to straighten back up are painful and then it gets a little irritated again.  He is otherwise able to walk well he has not tried mountain biking again yet.  Past Medical History:  Diagnosis Date   Anxiety    GERD (gastroesophageal reflux disease)    GERD (gastroesophageal reflux disease)    Hyperlipidemia    Hypertension    Pityriasis rosea    Thyroid disease     Past Surgical History:  Procedure Laterality Date   ESOPHAGOGASTRODUODENOSCOPY  2018   SHOULDER SURGERY  1998    Left, repair of labrum   UMBILICAL HERNIA REPAIR  1996   UPPER GASTROINTESTINAL ENDOSCOPY      Family History  Problem Relation Age of Onset   Hyperlipidemia Father    Prostate cancer Father    Bipolar disorder Brother    Asthma Mother    Cirrhosis Mother    Colon polyps Mother    Cirrhosis Maternal Grandmother    Colon cancer Neg Hx    Esophageal cancer Neg Hx    Rectal cancer Neg Hx    Stomach cancer Neg Hx     Social History   Socioeconomic History   Marital status: Married    Spouse name: Stacie   Number of children: 1   Years of education: Not on file   Highest education level: Not on file  Occupational History   Occupation: [employer]  Tobacco Use   Smoking status: Never   Smokeless tobacco: Never  Vaping Use   Vaping Use: Never used  Substance and Sexual Activity   Alcohol use: Yes    Alcohol/week: 3.0 standard drinks    Types: 3 Cans of beer per week   Drug use: No   Sexual activity: Yes    Partners: Female  Other Topics Concern  Not on file  Social History Narrative   Occ exercise. Works for [employer].    Social Determinants of Health   Financial Resource Strain: Not on file  Food Insecurity: Not on file  Transportation Needs: Not on file  Physical Activity: Not on file  Stress: Not on file  Social Connections: Not on file  Intimate Partner Violence: Not on file    Outpatient Medications Prior to Visit  Medication Sig Dispense Refill   atorvastatin (LIPITOR) 20 MG tablet Take 1 tablet (20 mg total) by mouth at bedtime. 90 tablet 3   Fexofenadine HCl (ALLEGRA PO) Take by mouth.     levothyroxine (SYNTHROID) 25 MCG tablet TAKE 1 TABLET BY MOUTH EVERY DAY 30 tablet 1   omeprazole (PRILOSEC) 20 MG capsule TAKE 1 CAPSULE BY MOUTH TWICE A DAY 180 capsule 2   amLODipine (NORVASC) 10 MG tablet TAKE 1 TABLET BY MOUTH EVERY DAY 90 tablet 0   methylPREDNISolone (MEDROL DOSEPAK) 4 MG TBPK tablet tad 21 tablet 0   omeprazole  (PRILOSEC) 20 MG capsule Take 1 capsule (20 mg total) by mouth 2 (two) times daily before a meal. 90 capsule 1   No facility-administered medications prior to visit.    Allergies  Allergen Reactions   Iodine     Had a reaction to IV dye with skin rash   Losartan Cough    Cough   Lisinopril     REACTION: cough Other reaction(s): Cough REACTION: cough    ROS Review of Systems    Objective:    Physical Exam Constitutional:      Appearance: Normal appearance. He is well-developed.  HENT:     Head: Normocephalic and atraumatic.     Right Ear: Tympanic membrane, ear canal and external ear normal.     Left Ear: Tympanic membrane, ear canal and external ear normal.     Nose: Nose normal.     Mouth/Throat:     Mouth: Mucous membranes are moist.     Pharynx: Oropharynx is clear. No posterior oropharyngeal erythema.  Eyes:     Conjunctiva/sclera: Conjunctivae normal.     Pupils: Pupils are equal, round, and reactive to light.  Neck:     Thyroid: No thyromegaly.  Cardiovascular:     Rate and Rhythm: Normal rate and regular rhythm.     Heart sounds: Normal heart sounds.  Pulmonary:     Effort: Pulmonary effort is normal.     Breath sounds: Normal breath sounds.  Musculoskeletal:     Cervical back: Neck supple.  Lymphadenopathy:     Cervical: No cervical adenopathy.  Skin:    General: Skin is warm and dry.  Neurological:     Mental Status: He is alert and oriented to person, place, and time. Mental status is at baseline.  Psychiatric:        Mood and Affect: Mood normal.        Behavior: Behavior normal.   BP (!) 147/85 (BP Location: Left Arm)    Pulse 92    Resp 16    Ht 5\' 11"  (1.803 m)    Wt 192 lb (87.1 kg)    BMI 26.78 kg/m  Wt Readings from Last 3 Encounters:  12/16/21 192 lb (87.1 kg)  04/15/21 196 lb (88.9 kg)  02/02/21 198 lb (89.8 kg)     There are no preventive care reminders to display for this patient.   There are no preventive care reminders to  display for this  patient.  Lab Results  Component Value Date   TSH 7.07 (H) 10/15/2020   Lab Results  Component Value Date   WBC 8.4 11/26/2020   HGB 16.0 11/26/2020   HCT 45.6 11/26/2020   MCV 85.9 11/26/2020   PLT 304 11/26/2020   Lab Results  Component Value Date   NA 141 04/15/2021   K 4.3 04/15/2021   CO2 26 04/15/2021   GLUCOSE 86 04/15/2021   BUN 12 04/15/2021   CREATININE 1.25 04/15/2021   BILITOT 0.7 04/15/2021   ALKPHOS 57 12/03/2019   AST 17 04/15/2021   ALT 25 04/15/2021   PROT 7.0 04/15/2021   ALBUMIN 5.0 12/03/2019   CALCIUM 9.8 04/15/2021   GFR 65.42 12/03/2019   Lab Results  Component Value Date   CHOL 133 07/21/2021   Lab Results  Component Value Date   HDL 40 07/21/2021   Lab Results  Component Value Date   LDLCALC 66 07/21/2021   Lab Results  Component Value Date   TRIG 135 07/21/2021   Lab Results  Component Value Date   CHOLHDL 4.0 04/15/2021   Lab Results  Component Value Date   HGBA1C 4.7 07/21/2021      Assessment & Plan:   Problem List Items Addressed This Visit       Cardiovascular and Mediastinum   Essential hypertension - Primary    Pressure not well controlled here and when I look back he typically runs in the 130s he says he does get some 120s at home.  I think him get a make a slight change to his regimen today and we will have him follow-up in about 3 to 4 weeks.  He does not tolerate ACEs or arms so we will add 12.5 mg of HCTZ.      Relevant Medications   hydrochlorothiazide (MICROZIDE) 12.5 MG capsule   amLODipine (NORVASC) 10 MG tablet   Other Relevant Orders   Lipid Panel w/reflex Direct LDL   COMPLETE METABOLIC PANEL WITH GFR   CBC     Other   HYPERTRIGLYCERIDEMIA    Due to recheck lipids.  Continue atorvastatin.      Relevant Medications   hydrochlorothiazide (MICROZIDE) 12.5 MG capsule   amLODipine (NORVASC) 10 MG tablet   Other Relevant Orders   Lipid Panel w/reflex Direct LDL   COMPLETE  METABOLIC PANEL WITH GFR   CBC   Other Visit Diagnoses     Viral upper respiratory tract infection       Acute pain of right knee           Upper respiratory infection-recommend continue with symptomatic care he does feel a little bit better today but if he suddenly feels worse in the next few days please give [place] a call back or if just not better after the weekend and we will treat with an antibiotic for sinusitis at that point.  Right knee pain-right now it is not bothering him that much.  Its not as severe as when it was initially injured.  Discussed getting in with our sports med doc if it continues to be more persistent or if it is keeping him from exercising especially once he starts mountain biking again.  For now we will just conservative care with ice and anti-inflammatory as needed.  He other wise has good function of the knee  Meds ordered this encounter  Medications   hydrochlorothiazide (MICROZIDE) 12.5 MG capsule    Sig: Take 1 capsule (12.5 mg total) by mouth  daily.    Dispense:  90 capsule    Refill:  0   amLODipine (NORVASC) 10 MG tablet    Sig: Take 1 tablet (10 mg total) by mouth daily.    Dispense:  90 tablet    Refill:  1    Follow-up: Return in about 6 months (around 06/15/2022) for Hypertension.    Beatrice Lecher, MD

## 2021-12-16 NOTE — Progress Notes (Signed)
Hi Brad, your LDL looks much better.  And triglycerides are doing okay.  Metabolic panel are normal.  Lets continue with your current regimen.  I do want to have you come back in a couple weeks to recheck your blood pressure since it was still high today.

## 2021-12-17 NOTE — Telephone Encounter (Signed)
See result notes. 

## 2021-12-20 MED ORDER — AZITHROMYCIN 250 MG PO TABS: ORAL_TABLET | ORAL | 0 refills | Status: AC

## 2021-12-20 NOTE — Telephone Encounter (Signed)
Meds ordered this encounter  ?Medications  ? azithromycin (ZITHROMAX) 250 MG tablet  ?  Sig: 2 Ttabs PO on Day 1, then one a day x 4 days.  ?  Dispense:  6 tablet  ?  Refill:  0  ? ? ?

## 2021-12-20 NOTE — Progress Notes (Signed)
Antibiotics sent to pharmacy

## 2021-12-31 ENCOUNTER — Other Ambulatory Visit: Payer: Self-pay

## 2021-12-31 ENCOUNTER — Ambulatory Visit: Payer: BC Managed Care – PPO | Admitting: Family Medicine

## 2021-12-31 VITALS — BP 129/84 | HR 98 | Temp 98.2°F | Ht 71.0 in | Wt 196.0 lb

## 2021-12-31 DIAGNOSIS — I10 Essential (primary) hypertension: Secondary | ICD-10-CM | POA: Diagnosis not present

## 2021-12-31 NOTE — Progress Notes (Signed)
Patient is here for a blood pressure check. Denies chest pains, palpitations, lightheadedness, dizziness, blurry vision, headaches, mood, sleep, or medication changes.   Patient did bring his own portable blood pressure machine. Blood pressure reading was 133/88 (95).   Patient's blood pressure was not at goal with clinic's machine. Patient sat for 10 minutes and recheck was at goal. Patient advised to schedule next appointment as needed.

## 2022-01-11 ENCOUNTER — Other Ambulatory Visit: Payer: Self-pay | Admitting: Family Medicine

## 2022-01-11 DIAGNOSIS — E781 Pure hyperglyceridemia: Secondary | ICD-10-CM

## 2022-01-29 ENCOUNTER — Emergency Department
Admission: RE | Admit: 2022-01-29 | Discharge: 2022-01-29 | Disposition: A | Discharge status: Home / Self Care | Payer: BC Managed Care – PPO | Source: Ambulatory Visit | Attending: Family Medicine | Admitting: Family Medicine

## 2022-01-29 ENCOUNTER — Other Ambulatory Visit: Payer: Self-pay

## 2022-01-29 VITALS — BP 156/94 | HR 78 | Temp 98.6°F | Resp 18 | Ht 71.0 in | Wt 192.0 lb

## 2022-01-29 DIAGNOSIS — J069 Acute upper respiratory infection, unspecified: Secondary | ICD-10-CM | POA: Diagnosis not present

## 2022-01-29 MED ORDER — AZITHROMYCIN 250 MG PO TABS: 250.0000 mg | ORAL_TABLET | Freq: Every day | ORAL | 0 refills | Status: DC

## 2022-01-29 MED ORDER — PREDNISONE 20 MG PO TABS: 20.0000 mg | ORAL_TABLET | Freq: Two times a day (BID) | ORAL | 0 refills | Status: DC

## 2022-01-29 MED ORDER — HYDROCOD POLI-CHLORPHE POLI ER 10-8 MG/5ML PO SUER: 5.0000 mL | Freq: Two times a day (BID) | ORAL | 0 refills | Status: DC | PRN

## 2022-01-29 NOTE — ED Triage Notes (Signed)
Pt states that he has a cough, headache, and some head congestion. X1 week ? ?Pt states that he is vaccinated for covid. ?Pt states that he hasn't had flu vaccine.  ?

## 2022-01-29 NOTE — ED Provider Notes (Signed)
?[facility] ? ? ? ?CSN: [id number] ?Arrival date & time: 01/29/22  1342 ? ? ?  ? ?History   ?Chief Complaint ?Chief Complaint  ?Patient presents with  ? Cough  ?  Cough, headache and head congestion. X1 week  ? ? ?HPI ?Robert Morris is a 46 y.o. male.  ? ?HPI ? ?Patient has a respiratory infection its been lasting over a week.  Cough.  Sputum production.  Harsh cough with coughing spells.  Chest pain with coughing.  No shortness of breath.  Sputum production that is sometimes clear and sometimes colored.  No fever or chills.  No body aches or fatigue. ?No underlying lung disease ?Non-smoker ? ?Past Medical History:  ?Diagnosis Date  ? Anxiety   ? GERD (gastroesophageal reflux disease)   ? GERD (gastroesophageal reflux disease)   ? Hyperlipidemia   ? Hypertension   ? Pityriasis rosea   ? Thyroid disease   ? ? ?Patient Active Problem List  ? Diagnosis Date Noted  ? Subclinical hypothyroidism 10/20/2020  ? Anxiety with flying 04/16/2020  ? Snoring 10/23/2019  ? Acute midline low back pain without sciatica 03/12/2019  ? Gastroesophageal reflux disease with esophagitis 03/12/2019  ? Family history of prostate cancer in father 08/10/2018  ? Family history of hepatic cirrhosis 08/10/2018  ? DDD (degenerative disc disease), cervical 08/30/2016  ? Essential hypertension 03/08/2016  ? Other seasonal allergic rhinitis 12/11/2015  ? Infertility male 12/06/2011  ? HYPERTRIGLYCERIDEMIA 10/29/2008  ? Hypertriglyceridemia 10/29/2008  ? ? ?Past Surgical History:  ?Procedure Laterality Date  ? ESOPHAGOGASTRODUODENOSCOPY  2018  ? SHOULDER SURGERY  1998  ? Left, repair of labrum  ? [place]  ? UPPER GASTROINTESTINAL ENDOSCOPY    ? ? ? ? ? ?Home Medications   ? ?Prior to Admission medications   ?Medication Sig Start Date End Date Taking? Authorizing Provider  ?amLODipine (NORVASC) 10 MG tablet Take 1 tablet (10 mg total) by mouth daily. 12/16/21  Yes Hali Marry, MD  ?atorvastatin (LIPITOR) 20  MG tablet TAKE 1 TABLET BY MOUTH EVERY DAY 01/12/22  Yes Hali Marry, MD  ?azithromycin (ZITHROMAX) 250 MG tablet Take 1 tablet (250 mg total) by mouth daily. Take first 2 tablets together, then 1 every day until finished. 01/29/22  Yes Raylene Everts, MD  ?chlorpheniramine-HYDROcodone [facility]) 10-8 MG/5ML Take 5 mLs by mouth every 12 (twelve) hours as needed for cough. 01/29/22  Yes Raylene Everts, MD  ?Fexofenadine HCl (ALLEGRA PO) Take by mouth.   Yes [provider]  ?hydrochlorothiazide (MICROZIDE) 12.5 MG capsule Take 1 capsule (12.5 mg total) by mouth daily. 12/16/21  Yes Hali Marry, MD  ?levothyroxine (SYNTHROID) 25 MCG tablet TAKE 1 TABLET BY MOUTH EVERY DAY 06/24/21  Yes Hali Marry, MD  ?omeprazole (PRILOSEC) 20 MG capsule TAKE 1 CAPSULE BY MOUTH TWICE A DAY 11/20/20  Yes Cirigliano, Vito V, DO  ?predniSONE (DELTASONE) 20 MG tablet Take 1 tablet (20 mg total) by mouth 2 (two) times daily with a meal. 01/29/22  Yes Raylene Everts, MD  ? ? ?Family History ?Family History  ?Problem Relation Age of Onset  ? Hyperlipidemia Father   ? Prostate cancer Father   ? Bipolar disorder Brother   ? Asthma Mother   ? Cirrhosis Mother   ? Colon polyps Mother   ? Cirrhosis Maternal Grandmother   ? Colon cancer Neg Hx   ? Esophageal cancer Neg Hx   ?  Rectal cancer Neg Hx   ? Stomach cancer Neg Hx   ? ? ?Social History ?Social History  ? ?Tobacco Use  ? Smoking status: Never  ? Smokeless tobacco: Never  ?Vaping Use  ? Vaping Use: Never used  ?Substance Use Topics  ? Alcohol use: Not Currently  ?  Alcohol/week: 3.0 standard drinks  ?  Types: 3 Cans of beer per week  ? Drug use: No  ? ? ? ?Allergies   ?Iodine, Losartan, and Lisinopril ? ? ?Review of Systems ?Review of Systems ?See HPI ? ?Physical Exam ?Triage Vital Signs ?ED Triage Vitals  ?Enc Vitals Group  ?   BP 01/29/22 1419 (!) 156/94  ?   Pulse Rate 01/29/22 1419 78  ?   Resp 01/29/22 1419 18  ?   Temp 01/29/22  1419 98.6 ?F (37 ?C)  ?   Temp Source 01/29/22 1419 Oral  ?   SpO2 01/29/22 1419 96 %  ?   Weight 01/29/22 1418 192 lb (87.1 kg)  ?   Height 01/29/22 1418 '5\' 11"'$  (1.803 m)  ?   Head Circumference --   ?   Peak Flow --   ?   Pain Score 01/29/22 1417 5  ?   Pain Loc --   ?   Pain Edu? --   ?   Excl. in [place]? --   ? ?No data found. ? ?Updated Vital Signs ?BP (!) 156/94 (BP Location: Right Arm)   Pulse 78   Temp 98.6 ?F (37 ?C) (Oral)   Resp 18   Ht '5\' 11"'$  (1.803 m)   Wt 87.1 kg   SpO2 96%   BMI 26.78 kg/m?  ?   ? ?Physical Exam ?Constitutional:   ?   General: He is not in acute distress. ?   Appearance: He is well-developed and normal weight.  ?HENT:  ?   Head: Normocephalic and atraumatic.  ?   Right Ear: Tympanic membrane and ear canal normal.  ?   Left Ear: Tympanic membrane and ear canal normal.  ?   Nose: Congestion and rhinorrhea present.  ?   Mouth/Throat:  ?   Pharynx: No posterior oropharyngeal erythema.  ?Eyes:  ?   Conjunctiva/sclera: Conjunctivae normal.  ?   Pupils: Pupils are equal, round, and reactive to light.  ?Cardiovascular:  ?   Rate and Rhythm: Normal rate and regular rhythm.  ?   Heart sounds: Normal heart sounds.  ?Pulmonary:  ?   Effort: Pulmonary effort is normal. No respiratory distress.  ?   Breath sounds: Wheezing and rhonchi present.  ?   Comments: Few wheezes and rhonchi heard anteriorly.  No rales ?Abdominal:  ?   General: There is no distension.  ?   Palpations: Abdomen is soft.  ?Musculoskeletal:     ?   General: Normal range of motion.  ?   Cervical back: Normal range of motion.  ?Lymphadenopathy:  ?   Cervical: No cervical adenopathy.  ?Skin: ?   General: Skin is warm and dry.  ?Neurological:  ?   Mental Status: He is alert.  ?Psychiatric:     ?   Mood and Affect: Mood normal.     ?   Behavior: Behavior normal.  ? ? ? ?UC Treatments / Results  ?Labs ?(all labs ordered are listed, but only abnormal results are displayed) ?Labs Reviewed - No data to  display ? ?EKG ? ? ?Radiology ?No results found. ? ?Procedures ?Procedures (including critical care  time) ? ?Medications Ordered in UC ?Medications - No data to display ? ?Initial Impression / Assessment and Plan / UC Course  ?I have reviewed the triage vital signs and the nursing notes. ? ?Pertinent labs & imaging results that were available during my care of the patient were reviewed by me and considered in my medical decision making (see chart for details). ? ?  ? ?Final Clinical Impressions(s) / UC Diagnoses  ? ?Final diagnoses:  ?Viral URI with cough  ? ? ? ?Discharge Instructions   ? ?  ?Continue to drink lots of water ?Run a humidifier in your bedroom if you have 1 ?Take the antibiotic as prescribed ?Take prednisone 2 times a day for 5 days ?Cough medicine is prescribed ? ? ?ED Prescriptions   ? ? Medication Sig Dispense Auth. Provider  ? azithromycin (ZITHROMAX) 250 MG tablet Take 1 tablet (250 mg total) by mouth daily. Take first 2 tablets together, then 1 every day until finished. 6 tablet Raylene Everts, MD  ? predniSONE (DELTASONE) 20 MG tablet Take 1 tablet (20 mg total) by mouth 2 (two) times daily with a meal. 10 tablet Raylene Everts, MD  ? chlorpheniramine-HYDROcodone [facility]) 10-8 MG/5ML Take 5 mLs by mouth every 12 (twelve) hours as needed for cough. 115 mL Raylene Everts, MD  ? ?  ? ?PDMP not reviewed this encounter. ?  ?Raylene Everts, MD ?01/29/22 1501 ? ?

## 2022-01-29 NOTE — Discharge Instructions (Signed)
Continue to drink lots of water ?Run a humidifier in your bedroom if you have 1 ?Take the antibiotic as prescribed ?Take prednisone 2 times a day for 5 days ?Cough medicine is prescribed ?

## 2022-03-08 ENCOUNTER — Other Ambulatory Visit: Payer: Self-pay | Admitting: Family Medicine

## 2022-03-08 DIAGNOSIS — I10 Essential (primary) hypertension: Secondary | ICD-10-CM

## 2022-04-04 ENCOUNTER — Ambulatory Visit: Payer: BC Managed Care – PPO | Admitting: Sports Medicine

## 2022-04-04 ENCOUNTER — Encounter: Payer: Self-pay | Admitting: Sports Medicine

## 2022-04-04 DIAGNOSIS — R109 Unspecified abdominal pain: Secondary | ICD-10-CM | POA: Insufficient documentation

## 2022-04-04 DIAGNOSIS — R1013 Epigastric pain: Secondary | ICD-10-CM

## 2022-04-04 DIAGNOSIS — G8929 Other chronic pain: Secondary | ICD-10-CM | POA: Diagnosis not present

## 2022-04-04 DIAGNOSIS — M25561 Pain in right knee: Secondary | ICD-10-CM | POA: Diagnosis not present

## 2022-04-04 MED ORDER — DEXLANSOPRAZOLE 60 MG PO CPDR: 60.0000 mg | DELAYED_RELEASE_CAPSULE | Freq: Every day | ORAL | 11 refills | Status: DC

## 2022-04-04 MED ORDER — SUCRALFATE 1 G PO TABS
1.0000 g | ORAL_TABLET | Freq: Four times a day (QID) | ORAL | 0 refills | Status: DC
Start: 2022-04-04 — End: 2022-05-18

## 2022-04-04 NOTE — Assessment & Plan Note (Signed)
Medial joint line pain, for the most part benign exam with the exception of tenderness at the medial joint line. No pain with terminal flexion, negative McMurray's, ligaments are stable. We will revisit this at the follow-up visit. I do suspect meniscal injury.

## 2022-04-04 NOTE — Assessment & Plan Note (Addendum)
Pleasant 46 year old male, long history of midepigastric abdominal pain with radiation to the back. He tells me he has had an endoscopy in the past that did show gastritis. He is currently on omeprazole twice a day. Abdominal pain does not have any obvious precipitating or palliating factors. He has no melena, hematochezia. He does get occasional vomiting and nausea. Mild diarrhea. No excessive use of NSAIDs, caffeine, non-smoker, does not drink much alcohol maybe 2 beers total a week. No weight loss. Differential is broad but I think peptic ulcer disease, pancreatitis, biliary colic are in the top of the differential. We will add some labs. Also getting C. difficile studies due to longstanding PPI use, switching from omeprazole to Dexilant and adding Carafate. Abdominal ultrasound. Return to see me in about 6 weeks, will refer back for upper endoscopy if not significantly better.  Update: Labs mostly normal, ultrasound reveals fatty liver disease, increased echogenicity of the gallbladder, lets try the Carafate and other treatments for solid 6 weeks before considering additional imaging such as a HIDA scan.

## 2022-04-04 NOTE — Progress Notes (Addendum)
    Procedures performed today:    None.  Independent interpretation of notes and tests performed by another provider:   None.  Brief History, Exam, Impression, and Recommendations:    Abdominal pain Pleasant 46 year old male, long history of midepigastric abdominal pain with radiation to the back. He tells me he has had an endoscopy in the past that did show gastritis. He is currently on omeprazole twice a day. Abdominal pain does not have any obvious precipitating or palliating factors. He has no melena, hematochezia. He does get occasional vomiting and nausea. Mild diarrhea. No excessive use of NSAIDs, caffeine, non-smoker, does not drink much alcohol maybe 2 beers total a week. No weight loss. Differential is broad but I think peptic ulcer disease, pancreatitis, biliary colic are in the top of the differential. We will add some labs. Also getting C. difficile studies due to longstanding PPI use, switching from omeprazole to Dexilant and adding Carafate. Abdominal ultrasound. Return to see me in about 6 weeks, will refer back for upper endoscopy if not significantly better.  Update: Labs mostly normal, ultrasound reveals fatty liver disease, increased echogenicity of the gallbladder, lets try the Carafate and other treatments for solid 6 weeks before considering additional imaging such as a HIDA scan.  Chronic pain of right knee Medial joint line pain, for the most part benign exam with the exception of tenderness at the medial joint line. No pain with terminal flexion, negative McMurray's, ligaments are stable. We will revisit this at the follow-up visit. I do suspect meniscal injury.    ___________________________________________ Gwen Her. Dianah Field, M.D., ABFM., CAQSM. Primary Care and [place] Instructor of [place] [employer] [facility] [employer] [facility] [employer]

## 2022-04-05 LAB — COMPLETE METABOLIC PANEL WITH GFR
AG Ratio: 2 (calc) (ref 1.0–2.5)
ALT: 20 U/L (ref 9–46)
AST: 17 U/L (ref 10–40)
Albumin: 4.8 g/dL (ref 3.6–5.1)
Alkaline phosphatase (APISO): 50 U/L (ref 36–130)
BUN: 20 mg/dL (ref 7–25)
CO2: 28 mmol/L (ref 20–32)
Calcium: 9.7 mg/dL (ref 8.6–10.3)
Chloride: 103 mmol/L (ref 98–110)
Creat: 1.14 mg/dL (ref 0.60–1.29)
Globulin: 2.4 g/dL (calc) (ref 1.9–3.7)
Glucose, Bld: 82 mg/dL (ref 65–99)
Potassium: 4.5 mmol/L (ref 3.5–5.3)
Sodium: 139 mmol/L (ref 135–146)
Total Bilirubin: 0.9 mg/dL (ref 0.2–1.2)
Total Protein: 7.2 g/dL (ref 6.1–8.1)
eGFR: 80 mL/min/{1.73_m2} (ref >=60)

## 2022-04-05 LAB — CBC WITH DIFFERENTIAL/PLATELET
Absolute Monocytes: 559 cells/uL (ref 200–950)
Basophils Absolute: 69 cells/uL (ref 0–200)
Basophils Relative: 1 %
Eosinophils Absolute: 242 cells/uL (ref 15–500)
Eosinophils Relative: 3.5 %
HCT: 44.8 % (ref 38.5–50.0)
Hemoglobin: 15.5 g/dL (ref 13.2–17.1)
Lymphs Abs: 1877 cells/uL (ref 850–3900)
MCH: 30.4 pg (ref 27.0–33.0)
MCHC: 34.6 g/dL (ref 32.0–36.0)
MCV: 87.8 fL (ref 80.0–100.0)
MPV: 9.6 fL (ref 7.5–12.5)
Monocytes Relative: 8.1 %
Neutro Abs: 4154 cells/uL (ref 1500–7800)
Neutrophils Relative %: 60.2 %
Platelets: 312 10*3/uL (ref 140–400)
RBC: 5.1 10*6/uL (ref 4.20–5.80)
RDW: 13.4 % (ref 11.0–15.0)
Total Lymphocyte: 27.2 %
WBC: 6.9 10*3/uL (ref 3.8–10.8)

## 2022-04-05 LAB — LIPASE: Lipase: 32 U/L (ref 7–60)

## 2022-04-05 LAB — AMYLASE: Amylase: 34 U/L (ref 21–101)

## 2022-04-05 LAB — TSH: TSH: 2.96 mIU/L (ref 0.40–4.50)

## 2022-04-06 DIAGNOSIS — R1013 Epigastric pain: Secondary | ICD-10-CM | POA: Diagnosis not present

## 2022-04-07 LAB — C. DIFFICILE GDH AND TOXIN A/B
GDH ANTIGEN: NOT DETECTED
MICRO NUMBER:: 13443383
SPECIMEN QUALITY:: ADEQUATE
TOXIN A AND B: NOT DETECTED

## 2022-04-09 ENCOUNTER — Other Ambulatory Visit: Payer: Self-pay | Admitting: Family Medicine

## 2022-04-12 ENCOUNTER — Ambulatory Visit (INDEPENDENT_AMBULATORY_CARE_PROVIDER_SITE_OTHER): Payer: BC Managed Care – PPO

## 2022-04-12 DIAGNOSIS — R1013 Epigastric pain: Secondary | ICD-10-CM | POA: Diagnosis not present

## 2022-04-12 DIAGNOSIS — K760 Fatty (change of) liver, not elsewhere classified: Secondary | ICD-10-CM | POA: Diagnosis not present

## 2022-05-16 ENCOUNTER — Ambulatory Visit: Payer: BC Managed Care – PPO | Admitting: Sports Medicine

## 2022-05-18 ENCOUNTER — Other Ambulatory Visit: Payer: Self-pay | Admitting: Sports Medicine

## 2022-05-18 DIAGNOSIS — R1013 Epigastric pain: Secondary | ICD-10-CM

## 2022-05-18 MED ORDER — SUCRALFATE 1 G PO TABS: 1.0000 g | ORAL_TABLET | Freq: Four times a day (QID) | ORAL | 0 refills | Status: DC

## 2022-05-27 ENCOUNTER — Ambulatory Visit: Payer: BC Managed Care – PPO | Admitting: Sports Medicine

## 2022-06-10 ENCOUNTER — Ambulatory Visit: Payer: BC Managed Care – PPO | Admitting: Sports Medicine

## 2022-06-15 ENCOUNTER — Ambulatory Visit: Payer: BC Managed Care – PPO | Admitting: Family Medicine

## 2022-07-08 ENCOUNTER — Ambulatory Visit: Payer: BC Managed Care – PPO | Admitting: Sports Medicine

## 2022-07-08 ENCOUNTER — Encounter: Payer: Self-pay | Admitting: Sports Medicine

## 2022-07-08 DIAGNOSIS — R1013 Epigastric pain: Secondary | ICD-10-CM | POA: Diagnosis not present

## 2022-07-08 NOTE — Assessment & Plan Note (Signed)
Robert Morris returns, he is a pleasant 46 year old male, I saw him with midepigastric abdominal pain with radiation to the back, he did have an endoscopy in the past that had shown gastritis, he was on omeprazole, we switched him to [place] and added Carafate, we got some labs. Labs were negative, we also got an ultrasound that revealed hepatic steatosis and increased echogenicity of the gallbladder. We treated him for about 6 weeks and he returns today with symptoms essentially resolved. We gave him some anticipatory guidance with regards to his midepigastric pain, and what was likely a flare of gastritis, he will also continue to work on a healthy diet. He tells me his mother died of NASH syndrome, so I advised that we typically keep an eye on liver function every year. Return to see me as needed.

## 2022-07-08 NOTE — Progress Notes (Signed)
    Procedures performed today:    None.  Independent interpretation of notes and tests performed by another provider:   None.  Brief History, Exam, Impression, and Recommendations:    Abdominal pain Robert Morris returns, he is a pleasant 46 year old male, I saw him with midepigastric abdominal pain with radiation to the back, he did have an endoscopy in the past that had shown gastritis, he was on omeprazole, we switched him to [place] and added Carafate, we got some labs. Labs were negative, we also got an ultrasound that revealed hepatic steatosis and increased echogenicity of the gallbladder. We treated him for about 6 weeks and he returns today with symptoms essentially resolved. We gave him some anticipatory guidance with regards to his midepigastric pain, and what was likely a flare of gastritis, he will also continue to work on a healthy diet. He tells me his mother died of NASH syndrome, so I advised that we typically keep an eye on liver function every year. Return to see me as needed.    ____________________________________________ Gwen Her. Dianah Field, M.D., ABFM., CAQSM., AME. Primary Care and Sports Medicine [facility]  Adjunct Professor of [place] [employer] [facility] [employer]  Risk manager

## 2022-08-25 ENCOUNTER — Other Ambulatory Visit: Payer: Self-pay | Admitting: Family Medicine

## 2022-08-25 DIAGNOSIS — I10 Essential (primary) hypertension: Secondary | ICD-10-CM

## 2022-10-03 ENCOUNTER — Other Ambulatory Visit: Payer: Self-pay | Admitting: Family Medicine

## 2022-11-10 ENCOUNTER — Other Ambulatory Visit: Payer: Self-pay | Admitting: Family Medicine

## 2022-11-10 DIAGNOSIS — R1013 Epigastric pain: Secondary | ICD-10-CM

## 2022-11-30 ENCOUNTER — Telehealth: Payer: Self-pay | Admitting: Family Medicine

## 2022-11-30 DIAGNOSIS — I10 Essential (primary) hypertension: Secondary | ICD-10-CM

## 2022-12-01 NOTE — Telephone Encounter (Signed)
Patient is scheduled for 12/08/22, at 2:00, thanks.

## 2022-12-01 NOTE — Telephone Encounter (Signed)
Please call pt he will need to schedule a f/u for bp for his medication refill

## 2022-12-01 NOTE — Telephone Encounter (Signed)
Lvm for patient to call back to schedule a f/u for bp for his medication refill. Tvt

## 2022-12-07 ENCOUNTER — Other Ambulatory Visit: Payer: Self-pay | Admitting: Family Medicine

## 2022-12-07 DIAGNOSIS — R1013 Epigastric pain: Secondary | ICD-10-CM

## 2022-12-08 ENCOUNTER — Encounter: Payer: Self-pay | Admitting: Family Medicine

## 2022-12-08 ENCOUNTER — Ambulatory Visit: Payer: BC Managed Care – PPO | Admitting: Family Medicine

## 2022-12-08 VITALS — BP 136/84 | HR 77 | Ht 71.0 in | Wt 203.1 lb

## 2022-12-08 DIAGNOSIS — K2100 Gastro-esophageal reflux disease with esophagitis, without bleeding: Secondary | ICD-10-CM

## 2022-12-08 DIAGNOSIS — E038 Other specified hypothyroidism: Secondary | ICD-10-CM

## 2022-12-08 DIAGNOSIS — R0789 Other chest pain: Secondary | ICD-10-CM | POA: Diagnosis not present

## 2022-12-08 DIAGNOSIS — I10 Essential (primary) hypertension: Secondary | ICD-10-CM

## 2022-12-08 MED ORDER — AMLODIPINE BESYLATE 10 MG PO TABS: 10.0000 mg | ORAL_TABLET | Freq: Every day | ORAL | 0 refills | Status: DC

## 2022-12-08 MED ORDER — AMLODIPINE BESYLATE 10 MG PO TABS: 10.0000 mg | ORAL_TABLET | Freq: Every day | ORAL | 3 refills | Status: DC

## 2022-12-08 NOTE — Assessment & Plan Note (Signed)
Discussed options I think he should really consult with Dr. Gerrit Heck to discuss other treatment options.  Referral placed.

## 2022-12-08 NOTE — Assessment & Plan Note (Signed)
Currently on levothyroxine 25 mcg daily.  Will recheck TSH to make sure adequate.

## 2022-12-08 NOTE — Assessment & Plan Note (Signed)
Blood pressure is a little borderline elevated today but it sounds like he is getting great pressures at home so for now we will keep an eye on it he has brought his home blood pressure cuff and for comparison and in the past it was accurate.  Will make sure that he has refills on the medication and lab work is up-to-date.

## 2022-12-08 NOTE — Addendum Note (Signed)
Addended by: Beatrice Lecher D on: 12/08/2022 04:34 PM   Modules accepted: Orders

## 2022-12-08 NOTE — Progress Notes (Addendum)
Established Patient Office Visit  Subjective   Patient ID: Robert Morris, male    DOB: 05-30-1976  Age: 47 y.o. MRN: [id number]  Chief Complaint  Patient presents with   Hypertension   Medication Refill    HPI   Hypertension- Pt denies chest pain, SOB, dizziness, or heart palpitations.  Taking meds as directed w/o problems.  Denies medication side effects.  Reports home blood pressures have been running around 117/70 with the highest being around 125/82.  Hypothyroidism - Taking medication regularly in the AM away from food and vitamins, etc. No recent change to skin, hair, or energy levels.  F/U GERD  -currently on Dexilant.  He says it makes a big difference in his symptoms but he still having some breakthrough discomfort in the epigastric area slightly to the left especially after eating.  Some days he just will need it all because he does not want to have the discomfort.  But he says if he does not take the medication at all he will actually wake up choking and gagging at night from the reflux.  He will occasionally have some left-sided chest pain as well.  No specific triggers.  Mother also passed away last 03-11-23 from cryptogenic cirrhosis.  Interestingly his maternal grandmother also passed away fairly young from cirrhosis as well.    ROS    Objective:     BP 136/84 (BP Location: Left Arm, Patient Position: Sitting, Cuff Size: Large)   Pulse 77   Ht '5\' 11"'$  (1.803 m)   Wt 203 lb 1.9 oz (92.1 kg)   SpO2 99%   BMI 28.33 kg/m    Physical Exam Constitutional:      Appearance: He is well-developed.  HENT:     Head: Normocephalic and atraumatic.  Cardiovascular:     Rate and Rhythm: Normal rate and regular rhythm.     Heart sounds: Normal heart sounds.  Pulmonary:     Effort: Pulmonary effort is normal.     Breath sounds: Normal breath sounds.  Skin:    General: Skin is warm and dry.  Neurological:     Mental Status: He is alert and oriented to person, place,  and time.  Psychiatric:        Behavior: Behavior normal.      No results found for any visits on 12/08/22.    The 10-year ASCVD risk score (Arnett DK, et al., 2019) is: 3.5%    Assessment & Plan:   Problem List Items Addressed This Visit       Cardiovascular and Mediastinum   Essential hypertension - Primary    Blood pressure is a little borderline elevated today but it sounds like he is getting great pressures at home so for now we will keep an eye on it he has brought his home blood pressure cuff and for comparison and in the past it was accurate.  Will make sure that he has refills on the medication and lab work is up-to-date.      Relevant Medications   amLODipine (NORVASC) 10 MG tablet   Other Relevant Orders   Lipid Panel w/reflex Direct LDL   COMPLETE METABOLIC PANEL WITH GFR   CBC   TSH     Digestive   Gastroesophageal reflux disease with esophagitis    Discussed options I think he should really consult with Dr. Gerrit Heck to discuss other treatment options.  Referral placed.      Relevant Orders   Ambulatory referral to Gastroenterology  Lipid Panel w/reflex Direct LDL   COMPLETE METABOLIC PANEL WITH GFR   CBC   TSH     Endocrine   Subclinical hypothyroidism    Currently on levothyroxine 25 mcg daily.  Will recheck TSH to make sure adequate.      Relevant Orders   Lipid Panel w/reflex Direct LDL   COMPLETE METABOLIC PANEL WITH GFR   CBC   TSH   Atypical chest pain-I really suspect that it is more GI GERD related.  If it persist then consider further cardiac workup but it is not typical chest pain that we would see with cardiac issues.  His blood pressure is well-controlled at home he currently takes atorvastatin 20 mg daily. EKG shows rate of 76 bpm, normal sinus rhythm with incomplete bundle branch block, right-no significant change from prior that was performed January 2022.  No acute changes.  Return in about 6 months (around 06/08/2023) for  Hypertension.    Beatrice Lecher, MD

## 2022-12-12 DIAGNOSIS — I10 Essential (primary) hypertension: Secondary | ICD-10-CM | POA: Diagnosis not present

## 2022-12-12 DIAGNOSIS — E038 Other specified hypothyroidism: Secondary | ICD-10-CM | POA: Diagnosis not present

## 2022-12-12 DIAGNOSIS — K2100 Gastro-esophageal reflux disease with esophagitis, without bleeding: Secondary | ICD-10-CM | POA: Diagnosis not present

## 2022-12-13 LAB — COMPLETE METABOLIC PANEL WITH GFR
AG Ratio: 2 (calc) (ref 1.0–2.5)
ALT: 23 U/L (ref 9–46)
AST: 17 U/L (ref 10–40)
Albumin: 4.9 g/dL (ref 3.6–5.1)
Alkaline phosphatase (APISO): 71 U/L (ref 36–130)
BUN: 13 mg/dL (ref 7–25)
CO2: 26 mmol/L (ref 20–32)
Calcium: 9.6 mg/dL (ref 8.6–10.3)
Chloride: 101 mmol/L (ref 98–110)
Creat: 1.07 mg/dL (ref 0.60–1.29)
Globulin: 2.4 g/dL (calc) (ref 1.9–3.7)
Glucose, Bld: 87 mg/dL (ref 65–99)
Potassium: 4.3 mmol/L (ref 3.5–5.3)
Sodium: 139 mmol/L (ref 135–146)
Total Bilirubin: 0.8 mg/dL (ref 0.2–1.2)
Total Protein: 7.3 g/dL (ref 6.1–8.1)
eGFR: 86 mL/min/{1.73_m2} (ref >=60)

## 2022-12-13 LAB — CBC
HCT: 46.7 % (ref 38.5–50.0)
Hemoglobin: 16.3 g/dL (ref 13.2–17.1)
MCH: 29.6 pg (ref 27.0–33.0)
MCHC: 34.9 g/dL (ref 32.0–36.0)
MCV: 84.8 fL (ref 80.0–100.0)
MPV: 9.6 fL (ref 7.5–12.5)
Platelets: 282 10*3/uL (ref 140–400)
RBC: 5.51 10*6/uL (ref 4.20–5.80)
RDW: 13 % (ref 11.0–15.0)
WBC: 8.2 10*3/uL (ref 3.8–10.8)

## 2022-12-13 LAB — LIPID PANEL W/REFLEX DIRECT LDL
Cholesterol: 175 mg/dL (ref <200)
HDL: 43 mg/dL (ref >=40)
LDL Cholesterol (Calc): 82 mg/dL (calc)
Non-HDL Cholesterol (Calc): 132 mg/dL (calc) — ABNORMAL HIGH (ref <130)
Total CHOL/HDL Ratio: 4.1 (calc) (ref <5.0)
Triglycerides: 377 mg/dL — ABNORMAL HIGH (ref <150)

## 2022-12-13 LAB — TSH: TSH: 7.67 mIU/L — ABNORMAL HIGH (ref 0.40–4.50)

## 2022-12-13 MED ORDER — LEVOTHYROXINE SODIUM 50 MCG PO TABS: 50.0000 ug | ORAL_TABLET | Freq: Every day | ORAL | 0 refills | Status: DC

## 2022-12-13 NOTE — Addendum Note (Signed)
Addended by: Beatrice Lecher D on: 12/13/2022 11:01 AM   Modules accepted: Orders

## 2022-12-13 NOTE — Progress Notes (Signed)
Increase  thyroid 50 mcg      levothyroxine (SYNTHROID) 50 MCG tablet         Sig: Take 1 tablet (50 mcg total) by mouth daily before breakfast.         Dispense:  90 tablet         Refill:  0

## 2022-12-13 NOTE — Progress Notes (Signed)
Hi Brad, triglycerides are still high at 377.  LDL is under 100 which is great it is at 82.  Continue to work on healthy diet and regular exercise.  Thyroid medication might need to be adjusted again.  Just want to confirm with you that you have been taking it regularly and you have not been missing doses frequently.  If you are taking it consistently, on empty stomach an hour away from other foods or medications and at least 4 hours away from supplements then let please let me know as I need to adjust your dose.  All other labs normal.  The 10-year ASCVD risk score (Arnett DK, et al., 2019) is: 3.6%   Values used to calculate the score:     Age: 51 years     Sex: Male     Is Non-Hispanic African American: No     Diabetic: No     Tobacco smoker: No     Systolic Blood Pressure: 841 mmHg     Is BP treated: Yes     HDL Cholesterol: 43 mg/dL     Total Cholesterol: 175 mg/dL

## 2023-01-04 ENCOUNTER — Ambulatory Visit: Payer: BC Managed Care – PPO | Admitting: Physician Assistant

## 2023-03-05 ENCOUNTER — Other Ambulatory Visit: Payer: Self-pay | Admitting: Family Medicine

## 2023-03-05 DIAGNOSIS — E781 Pure hyperglyceridemia: Secondary | ICD-10-CM

## 2023-03-06 MED ORDER — ATORVASTATIN CALCIUM 20 MG PO TABS: 20.0000 mg | ORAL_TABLET | Freq: Every day | ORAL | 3 refills | Status: DC

## 2023-03-06 NOTE — Telephone Encounter (Signed)
Entered in error. Tvt

## 2023-03-06 NOTE — Telephone Encounter (Signed)
Please call pt he will need to f/u and fasting labs for refills

## 2023-03-08 ENCOUNTER — Other Ambulatory Visit: Payer: Self-pay | Admitting: Family Medicine

## 2023-04-28 ENCOUNTER — Other Ambulatory Visit: Payer: Self-pay | Admitting: Family Medicine

## 2023-04-28 DIAGNOSIS — I10 Essential (primary) hypertension: Secondary | ICD-10-CM

## 2023-04-29 ENCOUNTER — Other Ambulatory Visit: Payer: Self-pay | Admitting: Sports Medicine

## 2023-04-29 DIAGNOSIS — R1013 Epigastric pain: Secondary | ICD-10-CM

## 2023-05-08 DIAGNOSIS — M778 Other enthesopathies, not elsewhere classified: Secondary | ICD-10-CM | POA: Diagnosis not present

## 2023-05-08 DIAGNOSIS — M791 Myalgia, unspecified site: Secondary | ICD-10-CM | POA: Diagnosis not present

## 2023-05-08 DIAGNOSIS — M25521 Pain in right elbow: Secondary | ICD-10-CM | POA: Diagnosis not present

## 2023-05-08 DIAGNOSIS — T148XXA Other injury of unspecified body region, initial encounter: Secondary | ICD-10-CM | POA: Diagnosis not present

## 2023-05-10 DIAGNOSIS — K2100 Gastro-esophageal reflux disease with esophagitis, without bleeding: Secondary | ICD-10-CM | POA: Diagnosis not present

## 2023-05-10 DIAGNOSIS — R1319 Other dysphagia: Secondary | ICD-10-CM | POA: Diagnosis not present

## 2023-05-10 DIAGNOSIS — Z8379 Family history of other diseases of the digestive system: Secondary | ICD-10-CM | POA: Diagnosis not present

## 2023-05-10 DIAGNOSIS — K760 Fatty (change of) liver, not elsewhere classified: Secondary | ICD-10-CM | POA: Diagnosis not present

## 2023-05-22 DIAGNOSIS — T148XXA Other injury of unspecified body region, initial encounter: Secondary | ICD-10-CM | POA: Diagnosis not present

## 2023-05-22 DIAGNOSIS — M791 Myalgia, unspecified site: Secondary | ICD-10-CM | POA: Diagnosis not present

## 2023-05-22 DIAGNOSIS — M778 Other enthesopathies, not elsewhere classified: Secondary | ICD-10-CM | POA: Diagnosis not present

## 2023-05-22 DIAGNOSIS — M25521 Pain in right elbow: Secondary | ICD-10-CM | POA: Diagnosis not present

## 2023-06-08 ENCOUNTER — Ambulatory Visit: Payer: BC Managed Care – PPO | Admitting: Family Medicine

## 2023-06-19 DIAGNOSIS — M25521 Pain in right elbow: Secondary | ICD-10-CM | POA: Diagnosis not present

## 2023-06-19 DIAGNOSIS — M778 Other enthesopathies, not elsewhere classified: Secondary | ICD-10-CM | POA: Diagnosis not present

## 2023-07-10 ENCOUNTER — Telehealth: Payer: Self-pay | Admitting: Family Medicine

## 2023-07-10 NOTE — Telephone Encounter (Signed)
[facility] called they need clarification on a order for  amlodipine 10mg  please contact [phone]

## 2023-07-19 ENCOUNTER — Telehealth: Payer: Self-pay | Admitting: Family Medicine

## 2023-07-19 NOTE — Telephone Encounter (Signed)
Prescription Request  07/19/2023  LOV: 12/08/2022  What is the name of the medication or equipment? levothyroxine (SYNTHROID) 50 MCG tablet   Have you contacted your pharmacy to request a refill? Yes   Which pharmacy would you like this sent to?    [facility] - [place], [place] - [address] [place] [address] [place] [address] Phone: [phone] Fax: [phone]    Patient notified that their request is being sent to the clinical staff for review and that they should receive a response within 2 business days.   Please advise at [facility] [phone]

## 2023-07-22 ENCOUNTER — Other Ambulatory Visit: Payer: Self-pay | Admitting: Family Medicine

## 2023-07-24 MED ORDER — LEVOTHYROXINE SODIUM 50 MCG PO TABS: 50.0000 ug | ORAL_TABLET | Freq: Every day | ORAL | 0 refills | Status: DC

## 2023-07-28 NOTE — Telephone Encounter (Signed)
Called pharmacy - was told that the issue has been resolved and can disregard the message.

## 2023-07-28 NOTE — Telephone Encounter (Signed)
done

## 2023-08-16 DIAGNOSIS — M778 Other enthesopathies, not elsewhere classified: Secondary | ICD-10-CM | POA: Diagnosis not present

## 2023-08-16 DIAGNOSIS — M25521 Pain in right elbow: Secondary | ICD-10-CM | POA: Diagnosis not present

## 2023-08-25 ENCOUNTER — Telehealth: Payer: Self-pay | Admitting: Family Medicine

## 2023-08-25 NOTE — Telephone Encounter (Addendum)
Called to comfirm his allergies and pharmacy.   Dr. Linford Arnold is out of the office today and Monday.   I routed this to Joy however, he is not getting this done until Wednesday so will send to Dr. Linford Arnold for her to send something in for him when she returns.

## 2023-08-25 NOTE — Telephone Encounter (Signed)
Patient called.  He is wanting medication to help him relax  when he gets his mri on Wednesday.

## 2023-08-26 MED ORDER — LORAZEPAM 0.5 MG PO TABS: 0.5000 mg | ORAL_TABLET | Freq: Once | ORAL | 0 refills | Status: DC | PRN

## 2023-08-26 NOTE — Telephone Encounter (Signed)
Meds ordered this encounter  Medications   LORazepam (ATIVAN) 0.5 MG tablet    Sig: Take 1 tablet (0.5 mg total) by mouth once as needed for up to 1 dose for anxiety.    Dispense:  2 tablet    Refill:  0

## 2023-08-30 DIAGNOSIS — S56511A Strain of other extensor muscle, fascia and tendon at forearm level, right arm, initial encounter: Secondary | ICD-10-CM | POA: Diagnosis not present

## 2023-09-04 ENCOUNTER — Encounter: Payer: Self-pay | Admitting: Family Medicine

## 2023-09-04 ENCOUNTER — Ambulatory Visit: Payer: BC Managed Care – PPO | Admitting: Family Medicine

## 2023-09-04 VITALS — BP 126/70 | HR 78 | Ht 71.0 in | Wt 202.0 lb

## 2023-09-04 DIAGNOSIS — Z23 Encounter for immunization: Secondary | ICD-10-CM | POA: Diagnosis not present

## 2023-09-04 DIAGNOSIS — E039 Hypothyroidism, unspecified: Secondary | ICD-10-CM | POA: Diagnosis not present

## 2023-09-04 DIAGNOSIS — M7711 Lateral epicondylitis, right elbow: Secondary | ICD-10-CM | POA: Diagnosis not present

## 2023-09-04 DIAGNOSIS — M778 Other enthesopathies, not elsewhere classified: Secondary | ICD-10-CM | POA: Diagnosis not present

## 2023-09-04 DIAGNOSIS — K2100 Gastro-esophageal reflux disease with esophagitis, without bleeding: Secondary | ICD-10-CM | POA: Diagnosis not present

## 2023-09-04 DIAGNOSIS — I10 Essential (primary) hypertension: Secondary | ICD-10-CM

## 2023-09-04 MED ORDER — LEVOTHYROXINE SODIUM 50 MCG PO TABS: 50.0000 ug | ORAL_TABLET | Freq: Every day | ORAL | 1 refills | Status: DC

## 2023-09-04 NOTE — Assessment & Plan Note (Signed)
Well controlled. Continue current regimen. Follow up in  6 mo  

## 2023-09-04 NOTE — Assessment & Plan Note (Signed)
Currently on Dexilant and doing really well.

## 2023-09-04 NOTE — Progress Notes (Signed)
   Established Patient Office Visit  Subjective   Patient ID: Robert Morris, male    DOB: 09-25-1976  Age: 47 y.o. MRN: [id number]  Chief Complaint  Patient presents with   Hypertension    HPI  Hypertension- Pt denies chest pain, SOB, dizziness, or heart palpitations.  Taking meds as directed w/o problems.  Denies medication side effects.    Since starting the medication he has noticed a little bit more energy he does not come home from work completely exhausted.  No other major skin or hair changes.  GERD-being on the Dexilant has made a big difference in his symptom control.     ROS    Objective:     BP 126/70   Pulse 78   Ht 5\' 11"  (1.803 m)   Wt 202 lb (91.6 kg)   SpO2 99%   BMI 28.17 kg/m    Physical Exam Vitals and nursing note reviewed.  Constitutional:      Appearance: Normal appearance.  HENT:     Head: Normocephalic and atraumatic.  Eyes:     Conjunctiva/sclera: Conjunctivae normal.  Cardiovascular:     Rate and Rhythm: Normal rate and regular rhythm.  Pulmonary:     Effort: Pulmonary effort is normal.     Breath sounds: Normal breath sounds.  Musculoskeletal:     Cervical back: Neck supple.  Lymphadenopathy:     Cervical: No cervical adenopathy.  Skin:    General: Skin is warm and dry.  Neurological:     Mental Status: He is alert.  Psychiatric:        Mood and Affect: Mood normal.      No results found for any visits on 09/04/23.    The 10-year ASCVD risk score (Arnett DK, et al., 2019) is: 3.1%    Assessment & Plan:   Problem List Items Addressed This Visit       Cardiovascular and Mediastinum   Essential hypertension - Primary    Well controlled. Continue current regimen. Follow up in  31mo       Relevant Orders   CMP14+EGFR   TSH     Digestive   Gastroesophageal reflux disease with esophagitis    Currently on Dexilant and doing really well.        Endocrine   Hypothyroid    On 50 mcg daily levothyroxine.   Recheck TSH today.  Discussed getting to goal.  He has noticed some improvement in energy levels.      Relevant Medications   levothyroxine (SYNTHROID) 50 MCG tablet   Other Relevant Orders   CMP14+EGFR   TSH   Other Visit Diagnoses     Encounter for immunization       Relevant Orders   Tdap vaccine greater than or equal to 7yo IM (Completed)       Return in about 27 weeks (around 03/11/2024) for Hypertension.    Nani Gasser, MD

## 2023-09-04 NOTE — Assessment & Plan Note (Signed)
On 50 mcg daily levothyroxine.  Recheck TSH today.  Discussed getting to goal.  He has noticed some improvement in energy levels.

## 2023-09-05 LAB — CMP14+EGFR
ALT: 22 [IU]/L (ref 0–44)
AST: 17 [IU]/L (ref 0–40)
Albumin: 4.7 g/dL (ref 4.1–5.1)
Alkaline Phosphatase: 70 [IU]/L (ref 44–121)
BUN/Creatinine Ratio: 13 (ref 9–20)
BUN: 13 mg/dL (ref 6–24)
Bilirubin Total: 0.5 mg/dL (ref 0.0–1.2)
CO2: 23 mmol/L (ref 20–29)
Calcium: 9.1 mg/dL (ref 8.7–10.2)
Chloride: 101 mmol/L (ref 96–106)
Creatinine, Ser: 0.99 mg/dL (ref 0.76–1.27)
Globulin, Total: 2 g/dL (ref 1.5–4.5)
Glucose: 113 mg/dL — ABNORMAL HIGH (ref 70–99)
Potassium: 3.9 mmol/L (ref 3.5–5.2)
Sodium: 141 mmol/L (ref 134–144)
Total Protein: 6.7 g/dL (ref 6.0–8.5)
eGFR: 94 mL/min/{1.73_m2} (ref >59)

## 2023-09-05 LAB — TSH: TSH: 4.9 u[IU]/mL — ABNORMAL HIGH (ref 0.450–4.500)

## 2023-09-05 MED ORDER — LEVOTHYROXINE SODIUM 75 MCG PO TABS
75.0000 ug | ORAL_TABLET | Freq: Every day | ORAL | 0 refills | Status: DC
Start: 2023-09-05 — End: 2024-03-11

## 2023-09-05 NOTE — Progress Notes (Signed)
Hi Brad, metabolic panel overall looks good except glucose was low little elevated.  Thyroid level does look better it was 7 back in January it is now down to 4.9 we want a try to get it a little closer to 1-2.  Some actually going to increase your thyroid medication.  I will send over an updated prescription and then we will plan to recheck your level again in 8 weeks.  You can just call us around the time that you want to come in and we can have the lab order ready for you to come by and have it drawn.

## 2023-09-05 NOTE — Addendum Note (Signed)
Addended by: Nani Gasser D on: 09/05/2023 08:07 AM   Modules accepted: Orders

## 2023-10-06 ENCOUNTER — Ambulatory Visit
Admission: RE | Admit: 2023-10-06 | Discharge: 2023-10-06 | Disposition: A | Discharge status: Home / Self Care | Payer: BC Managed Care – PPO | Source: Ambulatory Visit | Attending: Family Medicine | Admitting: Family Medicine

## 2023-10-06 ENCOUNTER — Ambulatory Visit (INDEPENDENT_AMBULATORY_CARE_PROVIDER_SITE_OTHER): Payer: BC Managed Care – PPO

## 2023-10-06 VITALS — BP 148/110 | HR 118 | Temp 98.3°F | Resp 17

## 2023-10-06 DIAGNOSIS — R109 Unspecified abdominal pain: Secondary | ICD-10-CM | POA: Diagnosis not present

## 2023-10-06 DIAGNOSIS — R10819 Abdominal tenderness, unspecified site: Secondary | ICD-10-CM | POA: Diagnosis not present

## 2023-10-06 DIAGNOSIS — R1032 Left lower quadrant pain: Secondary | ICD-10-CM | POA: Diagnosis not present

## 2023-10-06 LAB — POCT URINALYSIS DIP (MANUAL ENTRY)
Bilirubin, UA: NEGATIVE
Blood, UA: NEGATIVE
Glucose, UA: NEGATIVE mg/dL
Ketones, POC UA: NEGATIVE mg/dL
Leukocytes, UA: NEGATIVE
Nitrite, UA: NEGATIVE
Protein Ur, POC: NEGATIVE mg/dL
Spec Grav, UA: 1.01 (ref 1.010–1.025)
Urobilinogen, UA: 0.2 U/dL
pH, UA: 7 (ref 5.0–8.0)

## 2023-10-06 MED ORDER — AMOXICILLIN-POT CLAVULANATE 875-125 MG PO TABS: ORAL_TABLET | ORAL | 0 refills | Status: DC

## 2023-10-06 NOTE — ED Provider Notes (Signed)
[facility]    CSN: [id number] Arrival date & time: 10/06/23  0931      History   Chief Complaint Chief Complaint  Patient presents with   Abdominal Pain    Been having gassy pain in abdomen left side of belly button for about 3 days. - Entered by patient    HPI Robert Morris is a 47 y.o. male.   Three days ago patient developed intermittent left abdominal pain that has now become constant.  He feels somewhat bloated and feels as if he should have a bowel movement, although his bowel movements have been normal.   He had no response from taking a laxative two days ago.  He denies nausea/vomiting, urinary symptoms, and fevers, chills, and sweats. Past surgical history of umbilical hernia repair 2007. Review of a screening colonoscopy report from 02/02/21 revealed the presence of a few small-mouthed diverticula in the sigmoid colon.  The history is provided by the patient.    Past Medical History:  Diagnosis Date   Anxiety    GERD (gastroesophageal reflux disease)    GERD (gastroesophageal reflux disease)    Hyperlipidemia    Hypertension    Pityriasis rosea    Thyroid disease     Patient Active Problem List   Diagnosis Date Noted   Abdominal pain 04/04/2022   Chronic pain of right knee 04/04/2022   Hypothyroid 10/20/2020   Anxiety with flying 04/16/2020   Snoring 10/23/2019   Acute midline low back pain without sciatica 03/12/2019   Gastroesophageal reflux disease with esophagitis 03/12/2019   Family history of prostate cancer in father 08/10/2018   Family history of hepatic cirrhosis 08/10/2018   DDD (degenerative disc disease), cervical 08/30/2016   Essential hypertension 03/08/2016   Other seasonal allergic rhinitis 12/11/2015   Infertility male 12/06/2011   HYPERTRIGLYCERIDEMIA 10/29/2008   Hypertriglyceridemia 10/29/2008    Past Surgical History:  Procedure Laterality Date   ESOPHAGOGASTRODUODENOSCOPY  2018   SHOULDER SURGERY  1998    Left, repair of labrum   UMBILICAL HERNIA REPAIR  1996   UPPER GASTROINTESTINAL ENDOSCOPY         Home Medications    Prior to Admission medications   Medication Sig Start Date End Date Taking? Authorizing Provider  amoxicillin-clavulanate (AUGMENTIN) 875-125 MG tablet Take one tab PO Q8hr for one week. 10/06/23  Yes Lattie Haw, MD  amLODipine (NORVASC) 10 MG tablet Take 1 tablet (10 mg total) by mouth daily. Appointment required for refills. Please call office to schedule. 12/08/22   Agapito Games, MD  atorvastatin (LIPITOR) 20 MG tablet Take 1 tablet (20 mg total) by mouth daily. 03/06/23   Agapito Games, MD  dexlansoprazole (DEXILANT) 60 MG capsule Take 1 capsule (60 mg total) by mouth daily. 04/04/22   Monica Becton, MD  levothyroxine (SYNTHROID) 75 MCG tablet Take 1 tablet (75 mcg total) by mouth daily before breakfast. 09/05/23   Agapito Games, MD    Family History Family History  Problem Relation Age of Onset   Hyperlipidemia Father    Prostate cancer Father    Bipolar disorder Brother    Asthma Mother    Cirrhosis Mother    Colon polyps Mother    Cirrhosis Maternal Grandmother    Colon cancer Neg Hx    Esophageal cancer Neg Hx    Rectal cancer Neg Hx    Stomach cancer Neg Hx     Social History Social History   Tobacco Use  Smoking status: Never   Smokeless tobacco: Never  Vaping Use   Vaping status: Never Used  Substance Use Topics   Alcohol use: Not Currently    Alcohol/week: 3.0 standard drinks of alcohol    Types: 3 Cans of beer per week   Drug use: No     Allergies   Iodine, Losartan, and Lisinopril   Review of Systems Review of Systems  Constitutional:  Positive for appetite change. Negative for activity change, chills, diaphoresis, fatigue and fever.  Cardiovascular: Negative.  Negative for palpitations and leg swelling.  Gastrointestinal:  Positive for abdominal pain. Negative for blood in stool, diarrhea,  nausea and vomiting.  Genitourinary:  Negative for dysuria, flank pain and frequency.  All other systems reviewed and are negative.    Physical Exam Triage Vital Signs ED Triage Vitals  Encounter Vitals Group     BP 10/06/23 0951 (!) 148/110     Systolic BP Percentile --      Diastolic BP Percentile --      Pulse Rate 10/06/23 0951 (!) 118     Resp 10/06/23 0951 17     Temp 10/06/23 0951 98.3 F (36.8 C)     Temp Source 10/06/23 0951 Oral     SpO2 10/06/23 0951 98 %     Weight --      Height --      Head Circumference --      Peak Flow --      Pain Score 10/06/23 0952 2     Pain Loc --      Pain Education --      Exclude from Growth Chart --    No data found.  Updated Vital Signs BP (!) 148/110 (BP Location: Right Arm)   Pulse (!) 118   Temp 98.3 F (36.8 C) (Oral)   Resp 17   SpO2 98%   Visual Acuity Right Eye Distance:   Left Eye Distance:   Bilateral Distance:    Right Eye Near:   Left Eye Near:    Bilateral Near:     Physical Exam Vitals and nursing note reviewed.  Constitutional:      General: He is not in acute distress.    Appearance: He is not ill-appearing.  HENT:     Head: Normocephalic.     Mouth/Throat:     Mouth: Mucous membranes are moist.     Pharynx: Oropharynx is clear.  Eyes:     Extraocular Movements: Extraocular movements intact.     Conjunctiva/sclera: Conjunctivae normal.     Pupils: Pupils are equal, round, and reactive to light.  Cardiovascular:     Rate and Rhythm: Regular rhythm. Tachycardia present.     Heart sounds: Normal heart sounds.  Pulmonary:     Effort: Pulmonary effort is normal.     Breath sounds: Normal breath sounds.  Abdominal:     General: Abdomen is flat. A surgical scar is present. Bowel sounds are normal. There is no distension.     Palpations: Abdomen is soft. There is no hepatomegaly, splenomegaly or mass.     Tenderness: There is abdominal tenderness in the left lower quadrant. There is no right CVA  tenderness or left CVA tenderness. Negative signs include Murphy's sign, McBurney's sign, psoas sign and obturator sign.       Comments: Diffuse mild tenderness to palpation left lower quadrant as noted on diagram.    Musculoskeletal:     Cervical back: Neck supple.  Lymphadenopathy:  Cervical: No cervical adenopathy.  Neurological:     Mental Status: He is alert.      UC Treatments / Results  Labs (all labs ordered are listed, but only abnormal results are displayed) Labs Reviewed  POCT URINALYSIS DIP (MANUAL ENTRY) Component Ref Range & Units 1 d ago  Color, UA yellow yellow  Clarity, UA clear clear  Glucose, UA negative mg/dL negative  Bilirubin, UA negative negative  Ketones, POC UA negative mg/dL negative  Spec Grav, UA 1.010 - 1.025 1.010  Blood, UA negative negative  pH, UA 5.0 - 8.0 7.0  Protein Ur, POC negative mg/dL negative  Urobilinogen, UA 0.2 or 1.0 E.U./dL 0.2  Nitrite, UA Negative Negative  Leukocytes, UA Negative Negative      EKG   Radiology DG Abd 2 Views  Result Date: 10/06/2023 CLINICAL DATA:  Left-sided abdominal pain since last Tuesday. Tenderness. EXAM: ABDOMEN - 2 VIEW COMPARISON:  Ultrasound 04/12/2022.  CT scan 12/05/2019 FINDINGS: Air-fluid level along the stomach in the upright view. There are a few scattered air-fluid levels along what appear to be colon but the colon is nondilated. Minimal small bowel gas. Air in the rectum. No obstruction. No definite free air beneath the diaphragm on the upright views. No abnormal calcifications. If there is persistent pain or concern additional CT workup as clinically appropriate IMPRESSION: Nonspecific bowel gas pattern. Few scattered air-fluid levels. No obstruction or dilatation. Electronically Signed   By: Karen Kays M.D.   On: 10/06/2023 11:52    Procedures Procedures (including critical care time)  Medications Ordered in UC Medications - No data to display  Initial Impression  / Assessment and Plan / UC Course  I have reviewed the triage vital signs and the nursing notes.  Pertinent labs & imaging results that were available during my care of the patient were reviewed by me and considered in my medical decision making (see chart for details).    2-view abdomen x-ray shows few scattered air-fluid levels along likely colon. Note history of diverticulosis.  Suspect diverticulitis. Begin Augmentin 875 Q8hr.  Followup with GI in about one week.  Final Clinical Impressions(s) / UC Diagnoses   Final diagnoses:  Abdominal pain, left lower quadrant     Discharge Instructions      Begin clear liquids for about 24 hours, then may begin a BRAT diet (Bananas, Rice, Applesauce, Toast) when improved. Then gradually advance to a regular diet as tolerated.   May take Tylenol if needed for pain. If symptoms become significantly worse during the night or over the weekend, proceed to the local emergency room.     ED Prescriptions     Medication Sig Dispense Auth. Provider   amoxicillin-clavulanate (AUGMENTIN) 875-125 MG tablet Take one tab PO Q8hr for one week. 21 tablet Lattie Haw, MD         Lattie Haw, MD 10/08/23 [phone]

## 2023-10-06 NOTE — ED Triage Notes (Signed)
Pt c/o LT sided abd pain since Tues. Laxative on Wed. Normal BMs. Tender area to LT side of belly button when pushes on it.

## 2023-10-06 NOTE — Discharge Instructions (Addendum)
Begin clear liquids for about 24 hours, then may begin a [employer] (Bananas, Rice, Applesauce, Toast) when improved. Then gradually advance to a regular diet as tolerated.   May take Tylenol if needed for pain. If symptoms become significantly worse during the night or over the weekend, proceed to the local emergency room.

## 2023-10-25 ENCOUNTER — Encounter: Payer: Self-pay | Admitting: Family Medicine

## 2023-10-27 DIAGNOSIS — R1310 Dysphagia, unspecified: Secondary | ICD-10-CM | POA: Diagnosis not present

## 2023-10-27 DIAGNOSIS — K2100 Gastro-esophageal reflux disease with esophagitis, without bleeding: Secondary | ICD-10-CM | POA: Diagnosis not present

## 2023-10-27 DIAGNOSIS — R1319 Other dysphagia: Secondary | ICD-10-CM | POA: Diagnosis not present

## 2023-11-15 DIAGNOSIS — R0789 Other chest pain: Secondary | ICD-10-CM | POA: Diagnosis not present

## 2023-11-15 DIAGNOSIS — R079 Chest pain, unspecified: Secondary | ICD-10-CM | POA: Diagnosis not present

## 2023-11-15 DIAGNOSIS — R42 Dizziness and giddiness: Secondary | ICD-10-CM | POA: Diagnosis not present

## 2023-11-15 DIAGNOSIS — R0602 Shortness of breath: Secondary | ICD-10-CM | POA: Diagnosis not present

## 2023-11-15 DIAGNOSIS — K219 Gastro-esophageal reflux disease without esophagitis: Secondary | ICD-10-CM | POA: Diagnosis not present

## 2023-11-15 DIAGNOSIS — R519 Headache, unspecified: Secondary | ICD-10-CM | POA: Diagnosis not present

## 2023-11-15 DIAGNOSIS — Z888 Allergy status to other drugs, medicaments and biological substances status: Secondary | ICD-10-CM | POA: Diagnosis not present

## 2023-11-15 DIAGNOSIS — R000 Tachycardia, unspecified: Secondary | ICD-10-CM | POA: Diagnosis not present

## 2023-11-15 DIAGNOSIS — Z79899 Other long term (current) drug therapy: Secondary | ICD-10-CM | POA: Diagnosis not present

## 2023-11-15 DIAGNOSIS — I10 Essential (primary) hypertension: Secondary | ICD-10-CM | POA: Diagnosis not present

## 2023-11-16 DIAGNOSIS — K2100 Gastro-esophageal reflux disease with esophagitis, without bleeding: Secondary | ICD-10-CM | POA: Diagnosis not present

## 2023-11-16 DIAGNOSIS — M7711 Lateral epicondylitis, right elbow: Secondary | ICD-10-CM | POA: Diagnosis not present

## 2023-11-16 DIAGNOSIS — Z79899 Other long term (current) drug therapy: Secondary | ICD-10-CM | POA: Diagnosis not present

## 2023-11-16 DIAGNOSIS — I10 Essential (primary) hypertension: Secondary | ICD-10-CM | POA: Diagnosis not present

## 2023-11-16 DIAGNOSIS — Z79890 Hormone replacement therapy (postmenopausal): Secondary | ICD-10-CM | POA: Diagnosis not present

## 2023-11-16 DIAGNOSIS — E038 Other specified hypothyroidism: Secondary | ICD-10-CM | POA: Diagnosis not present

## 2023-11-16 DIAGNOSIS — Z888 Allergy status to other drugs, medicaments and biological substances status: Secondary | ICD-10-CM | POA: Diagnosis not present

## 2023-11-30 DIAGNOSIS — Z4789 Encounter for other orthopedic aftercare: Secondary | ICD-10-CM | POA: Diagnosis not present

## 2024-01-01 DIAGNOSIS — M7711 Lateral epicondylitis, right elbow: Secondary | ICD-10-CM | POA: Diagnosis not present

## 2024-01-09 DIAGNOSIS — M25512 Pain in left shoulder: Secondary | ICD-10-CM | POA: Diagnosis not present

## 2024-01-10 DIAGNOSIS — M25319 Other instability, unspecified shoulder: Secondary | ICD-10-CM | POA: Diagnosis not present

## 2024-01-10 DIAGNOSIS — M25512 Pain in left shoulder: Secondary | ICD-10-CM | POA: Diagnosis not present

## 2024-01-10 DIAGNOSIS — M25612 Stiffness of left shoulder, not elsewhere classified: Secondary | ICD-10-CM | POA: Diagnosis not present

## 2024-01-22 DIAGNOSIS — M25612 Stiffness of left shoulder, not elsewhere classified: Secondary | ICD-10-CM | POA: Diagnosis not present

## 2024-01-22 DIAGNOSIS — M25512 Pain in left shoulder: Secondary | ICD-10-CM | POA: Diagnosis not present

## 2024-01-22 DIAGNOSIS — M25319 Other instability, unspecified shoulder: Secondary | ICD-10-CM | POA: Diagnosis not present

## 2024-02-07 DIAGNOSIS — M25612 Stiffness of left shoulder, not elsewhere classified: Secondary | ICD-10-CM | POA: Diagnosis not present

## 2024-02-07 DIAGNOSIS — M25319 Other instability, unspecified shoulder: Secondary | ICD-10-CM | POA: Diagnosis not present

## 2024-02-07 DIAGNOSIS — M25512 Pain in left shoulder: Secondary | ICD-10-CM | POA: Diagnosis not present

## 2024-02-28 DIAGNOSIS — M7711 Lateral epicondylitis, right elbow: Secondary | ICD-10-CM | POA: Diagnosis not present

## 2024-03-07 NOTE — Progress Notes (Signed)
Patient is here for a blood pressure check. Denies chest pains, palpitations, lightheadedness, dizziness, blurry vision, headaches, mood, sleep, or medication changes.   Patient did bring his own portable blood pressure machine. Blood pressure reading was 133/88 (95).   Patient's blood pressure was not at goal with clinic's machine. Patient sat for 10 minutes and recheck was at goal. Patient advised to schedule next appointment as needed.

## 2024-03-11 ENCOUNTER — Encounter: Payer: Self-pay | Admitting: Family Medicine

## 2024-03-11 ENCOUNTER — Ambulatory Visit: Payer: BC Managed Care – PPO | Admitting: Family Medicine

## 2024-03-11 VITALS — BP 120/70 | HR 65 | Ht 71.0 in | Wt 203.0 lb

## 2024-03-11 DIAGNOSIS — R7309 Other abnormal glucose: Secondary | ICD-10-CM

## 2024-03-11 DIAGNOSIS — K2100 Gastro-esophageal reflux disease with esophagitis, without bleeding: Secondary | ICD-10-CM | POA: Diagnosis not present

## 2024-03-11 DIAGNOSIS — E781 Pure hyperglyceridemia: Secondary | ICD-10-CM

## 2024-03-11 DIAGNOSIS — E039 Hypothyroidism, unspecified: Secondary | ICD-10-CM

## 2024-03-11 DIAGNOSIS — I10 Essential (primary) hypertension: Secondary | ICD-10-CM

## 2024-03-11 MED ORDER — LEVOTHYROXINE SODIUM 75 MCG PO TABS: 75.0000 ug | ORAL_TABLET | Freq: Every day | ORAL | 1 refills | Status: DC

## 2024-03-11 MED ORDER — ATORVASTATIN CALCIUM 20 MG PO TABS: 20.0000 mg | ORAL_TABLET | Freq: Every day | ORAL | 3 refills | Status: DC

## 2024-03-11 MED ORDER — OMEPRAZOLE 20 MG PO CPDR: 20.0000 mg | DELAYED_RELEASE_CAPSULE | Freq: Every day | ORAL | 3 refills | Status: AC

## 2024-03-11 NOTE — Assessment & Plan Note (Signed)
 Reports home BPS at goal.

## 2024-03-11 NOTE — Assessment & Plan Note (Signed)
 Due to recheck TSH.

## 2024-03-11 NOTE — Assessment & Plan Note (Signed)
Due to recheck lipids. 

## 2024-03-11 NOTE — Assessment & Plan Note (Addendum)
 Dexilant  just taking Meprazole 20 mg he says most days that actually does control his symptoms.  He would like for us  to send it is a prescription disabled fiddle be covered.

## 2024-03-11 NOTE — Progress Notes (Signed)
 Established Patient Office Visit  Subjective  Patient ID: Robert Morris, male    DOB: 21-Oct-1976  Age: 48 y.o. MRN: [id number]  Chief Complaint  Patient presents with   Hypertension   Hypothyroidism    HPI  Hypertension- Pt denies chest pain, SOB, dizziness, or heart palpitations.  Taking meds as directed w/o problems.  Denies medication side effects.    Hypothyroidism - Taking medication regularly in the AM away from food and vitamins, etc. No recent change to skin, hair, or energy levels.  Doing well o/w. Did go to ED in Jan for chest pain and had negative workup.    GERD - Off dexilant  and on prilosec OTC at bedtime. Works well most of the time  Occ takes a pepcid .       ROS    Objective:     BP 120/70 (BP Location: Left Arm, Cuff Size: Normal)   Pulse 65   Ht 5\' 11"  (1.803 m)   Wt 203 lb (92.1 kg)   SpO2 99%   BMI 28.31 kg/m    Physical Exam Vitals and nursing note reviewed.  Constitutional:      Appearance: Normal appearance.  HENT:     Head: Normocephalic and atraumatic.  Eyes:     Conjunctiva/sclera: Conjunctivae normal.  Cardiovascular:     Rate and Rhythm: Normal rate and regular rhythm.  Pulmonary:     Effort: Pulmonary effort is normal.     Breath sounds: Normal breath sounds.  Skin:    General: Skin is warm and dry.  Neurological:     Mental Status: He is alert.  Psychiatric:        Mood and Affect: Mood normal.     No results found for any visits on 03/11/24.    The 10-year ASCVD risk score (Arnett DK, et al., 2019) is: 3.2%    Assessment & Plan:   Problem List Items Addressed This Visit       Cardiovascular and Mediastinum   Essential hypertension - Primary   Reports home BPS at goal.       Relevant Medications   atorvastatin  (LIPITOR) 20 MG tablet   Other Relevant Orders   Lipid panel   TSH   Basic Metabolic Panel (BMET)   HgB A1c     Digestive   Gastroesophageal reflux disease with esophagitis   Dexilant   just taking Meprazole 20 mg he says most days that actually does control his symptoms.  He would like for us  to send it is a prescription disabled fiddle be covered.      Relevant Medications   omeprazole  (PRILOSEC) 20 MG capsule     Endocrine   Hypothyroid   Due to recheck TSH.       Relevant Medications   levothyroxine  (SYNTHROID ) 75 MCG tablet   Other Relevant Orders   Lipid panel   TSH   Basic Metabolic Panel (BMET)   HgB A1c     Other   HYPERTRIGLYCERIDEMIA   Relevant Medications   atorvastatin  (LIPITOR) 20 MG tablet   Hypertriglyceridemia   Due to recheck lipids.        Relevant Medications   atorvastatin  (LIPITOR) 20 MG tablet   Other Relevant Orders   Lipid panel   TSH   Basic Metabolic Panel (BMET)   HgB A1c   Other Visit Diagnoses       Abnormal glucose       Relevant Orders   HgB A1c  Return in about 6 months (around 09/10/2024) for Hypertension.    Duaine German, MD

## 2024-03-12 ENCOUNTER — Encounter: Payer: Self-pay | Admitting: Family Medicine

## 2024-03-12 LAB — LIPID PANEL
Chol/HDL Ratio: 4.3 ratio (ref 0.0–5.0)
Cholesterol, Total: 155 mg/dL (ref 100–199)
HDL: 36 mg/dL — ABNORMAL LOW (ref >39)
LDL Chol Calc (NIH): 68 mg/dL (ref 0–99)
Triglycerides: 320 mg/dL — ABNORMAL HIGH (ref 0–149)
VLDL Cholesterol Cal: 51 mg/dL — ABNORMAL HIGH (ref 5–40)

## 2024-03-12 LAB — BASIC METABOLIC PANEL WITH GFR
BUN/Creatinine Ratio: 10 (ref 9–20)
BUN: 13 mg/dL (ref 6–24)
CO2: 23 mmol/L (ref 20–29)
Calcium: 9.3 mg/dL (ref 8.7–10.2)
Chloride: 100 mmol/L (ref 96–106)
Creatinine, Ser: 1.28 mg/dL — ABNORMAL HIGH (ref 0.76–1.27)
Glucose: 85 mg/dL (ref 70–99)
Potassium: 4.4 mmol/L (ref 3.5–5.2)
Sodium: 137 mmol/L (ref 134–144)
eGFR: 69 mL/min/{1.73_m2} (ref >59)

## 2024-03-12 LAB — HEMOGLOBIN A1C
Est. average glucose Bld gHb Est-mCnc: 91 mg/dL
Hgb A1c MFr Bld: 4.8 % (ref 4.8–5.6)

## 2024-03-12 LAB — TSH: TSH: 4.72 u[IU]/mL — ABNORMAL HIGH (ref 0.450–4.500)

## 2024-03-12 MED ORDER — LEVOTHYROXINE SODIUM 88 MCG PO TABS
88.0000 ug | ORAL_TABLET | Freq: Every day | ORAL | 0 refills | Status: DC
Start: 2024-03-12 — End: 2024-06-24

## 2024-03-12 NOTE — Addendum Note (Signed)
 Addended by: Taunia Frasco D on: 03/12/2024 07:54 AM   Modules accepted: Orders

## 2024-03-12 NOTE — Progress Notes (Signed)
 Hi Robert Morris, LDL cholesterol looks good.  Triglycerides are still up a little bit.  You may want to consider adding some fish oil to your regimen.  Your kidney function is up a little but it is stable.  A1c looks great no sign of diabetes or prediabetes.  Your thyroid  level is still a little off I would actually like to go ahead and adjust your dose to 88 mcg Robert Morris.  I will send over new an updated prescription to the pharmacy and we will plan to recheck TSH in 6 to 8 weeks.

## 2024-04-02 MED ORDER — ATENOLOL 25 MG PO TABS: 25.0000 mg | ORAL_TABLET | Freq: Two times a day (BID) | ORAL | 0 refills | Status: AC

## 2024-05-06 ENCOUNTER — Encounter: Payer: Self-pay | Admitting: Family Medicine

## 2024-05-06 DIAGNOSIS — E039 Hypothyroidism, unspecified: Secondary | ICD-10-CM

## 2024-05-13 DIAGNOSIS — E039 Hypothyroidism, unspecified: Secondary | ICD-10-CM | POA: Diagnosis not present

## 2024-05-14 ENCOUNTER — Ambulatory Visit: Payer: Self-pay | Admitting: Family Medicine

## 2024-05-14 LAB — TSH: TSH: 3.89 u[IU]/mL (ref 0.450–4.500)

## 2024-05-14 NOTE — Progress Notes (Signed)
 Hi Brad, thyroid  looks much better.

## 2024-06-06 ENCOUNTER — Other Ambulatory Visit: Payer: Self-pay

## 2024-06-06 ENCOUNTER — Ambulatory Visit

## 2024-06-06 ENCOUNTER — Ambulatory Visit
Admission: EM | Admit: 2024-06-06 | Discharge: 2024-06-06 | Disposition: A | Discharge status: Home / Self Care | Attending: Family Medicine | Admitting: Family Medicine

## 2024-06-06 DIAGNOSIS — R079 Chest pain, unspecified: Secondary | ICD-10-CM

## 2024-06-06 DIAGNOSIS — I214 Non-ST elevation (NSTEMI) myocardial infarction: Secondary | ICD-10-CM | POA: Diagnosis not present

## 2024-06-06 DIAGNOSIS — M549 Dorsalgia, unspecified: Secondary | ICD-10-CM | POA: Diagnosis not present

## 2024-06-06 DIAGNOSIS — K219 Gastro-esophageal reflux disease without esophagitis: Secondary | ICD-10-CM | POA: Diagnosis not present

## 2024-06-06 DIAGNOSIS — R0789 Other chest pain: Secondary | ICD-10-CM

## 2024-06-06 NOTE — ED Provider Notes (Signed)
 [facility]    CSN: [id number] Arrival date & time: 06/06/24  9176      History   Chief Complaint Chief Complaint  Patient presents with   Chest Pain    HPI Robert Morris is a 48 y.o. male.   HPI  Patient has had intermittent chest pain that radiates through to his left shoulder blade area for the last couple of months.  It is worse today after he hit golf balls yesterday.  He has chronic GERD for which she takes medication.  He is not sure the medicine controls his symptoms well.  He states it feels like it is in his left chest and feels like a pressure.  It does come and go.  He can reproduce it with arm movements.  He has not been drinking alcohol or taking NSAID drugs.  He states he has had an endoscopy but it was many years ago.  Unknown if he has been treated or tested for H. pylori.  He states that he did have chest pain in January.  Went to the emergency room where an EKG and blood work confirmed he was not having a heart attack.  Patient believes it is probably not my heart.  The chest pain does concern him.  He has not had any additional cardiology evaluation.  He does have hyperlipidemia and hypertension.  Blood pressure is very elevated upon arrival.  It did get better with rest.  He states he has medical anxiety that causes his blood pressure to go up.  He states when he has the chest pain he sometimes feel dizzy.  No pain with deep breath.  No recent cough or chest infection.  No known underlying heart or lung disease  Past Medical History:  Diagnosis Date   Anxiety    GERD (gastroesophageal reflux disease)    GERD (gastroesophageal reflux disease)    Hyperlipidemia    Hypertension    Pityriasis rosea    Thyroid  disease     Patient Active Problem List   Diagnosis Date Noted   Abdominal pain 04/04/2022   Chronic pain of right knee 04/04/2022   Hypothyroid 10/20/2020   Anxiety with flying 04/16/2020   Snoring 10/23/2019   Acute midline low back  pain without sciatica 03/12/2019   Gastroesophageal reflux disease with esophagitis 03/12/2019   Family history of prostate cancer in father 08/10/2018   Family history of hepatic cirrhosis 08/10/2018   DDD (degenerative disc disease), cervical 08/30/2016   Essential hypertension 03/08/2016   Other seasonal allergic rhinitis 12/11/2015   Infertility male 12/06/2011   HYPERTRIGLYCERIDEMIA 10/29/2008   Hypertriglyceridemia 10/29/2008    Past Surgical History:  Procedure Laterality Date   ESOPHAGOGASTRODUODENOSCOPY  2018   SHOULDER SURGERY  1998   Left, repair of labrum   UMBILICAL HERNIA REPAIR  1996   UPPER GASTROINTESTINAL ENDOSCOPY         Home Medications    Prior to Admission medications   Medication Sig Start Date End Date Taking? Authorizing Provider  atenolol  (TENORMIN ) 25 MG tablet Take 1 tablet (25 mg total) by mouth 2 (two) times daily. 04/02/24   Alvan Dorothyann BIRCH, MD  atorvastatin  (LIPITOR) 20 MG tablet Take 1 tablet (20 mg total) by mouth daily. 03/11/24   Alvan Dorothyann BIRCH, MD  levothyroxine  (SYNTHROID ) 88 MCG tablet Take 1 tablet (88 mcg total) by mouth daily. 03/12/24   Alvan Dorothyann BIRCH, MD  omeprazole  (PRILOSEC) 20 MG capsule Take 1 capsule (20 mg total) by  mouth daily. 03/11/24   Alvan Dorothyann BIRCH, MD    Family History Family History  Problem Relation Age of Onset   Hyperlipidemia Father    Prostate cancer Father    Bipolar disorder Brother    Asthma Mother    Cirrhosis Mother    Colon polyps Mother    Cirrhosis Maternal Grandmother    Colon cancer Neg Hx    Esophageal cancer Neg Hx    Rectal cancer Neg Hx    Stomach cancer Neg Hx     Social History Social History   Tobacco Use   Smoking status: Never   Smokeless tobacco: Never  Vaping Use   Vaping status: Never Used  Substance Use Topics   Alcohol use: Not Currently    Alcohol/week: 3.0 standard drinks of alcohol    Types: 3 Cans of beer per week   Drug use: No      Allergies   Iodine, Losartan , and Lisinopril   Review of Systems Review of Systems See HPI  Physical Exam Triage Vital Signs ED Triage Vitals  Encounter Vitals Group     BP 06/06/24 0835 (!) 173/100     Girls Systolic BP Percentile --      Girls Diastolic BP Percentile --      Boys Systolic BP Percentile --      Boys Diastolic BP Percentile --      Pulse Rate 06/06/24 0835 70     Resp 06/06/24 0835 16     Temp 06/06/24 0835 98.1 F (36.7 C)     Temp src --      SpO2 06/06/24 0835 98 %     Weight --      Height --      Head Circumference --      Peak Flow --      Pain Score 06/06/24 0834 3     Pain Loc --      Pain Education --      Exclude from Growth Chart --    No data found.  Updated Vital Signs BP (!) 135/93   Pulse 66   Temp 98.1 F (36.7 C)   Resp 16   SpO2 98%      Physical Exam Constitutional:      General: He is not in acute distress.    Appearance: He is well-developed and normal weight.     Comments: Appears concerned about health  HENT:     Head: Normocephalic and atraumatic.  Eyes:     Conjunctiva/sclera: Conjunctivae normal.     Pupils: Pupils are equal, round, and reactive to light.  Cardiovascular:     Rate and Rhythm: Normal rate and regular rhythm. No extrasystoles are present.    Chest Wall: PMI is not displaced.     Heart sounds: Normal heart sounds.  Pulmonary:     Effort: Pulmonary effort is normal. No respiratory distress.     Breath sounds: Normal breath sounds.  Chest:     Chest wall: Tenderness present.  Abdominal:     General: Bowel sounds are normal. There is no distension.     Palpations: Abdomen is soft.     Tenderness: There is abdominal tenderness.     Comments: Very mild tenderness to deep palpation in the midepigastric region  Musculoskeletal:        General: Normal range of motion.     Cervical back: Normal range of motion.     Right lower leg: No edema.  Left lower leg: No edema.  Skin:    General:  Skin is warm and dry.  Neurological:     Mental Status: He is alert.      UC Treatments / Results  Labs (all labs ordered are listed, but only abnormal results are displayed) Labs Reviewed - No data to display  EKG EKG is unchanged from prior.  No ischemic changes or ST or T wave changes.  He does have an incomplete right bundle branch block.  Compared to EKG from 11/03/2023 and private physician office in the ER in January 2025  Radiology DG Chest 2 View Result Date: 06/06/2024 EXAM: 2 VIEW(S) XRAY OF THE CHEST 06/06/2024 09:09:38 AM COMPARISON: 09/16/2021 CLINICAL HISTORY: Chest pain. Back and chest discomfort for a couple of months. Went to hit golf balls yesterday which caused him to have chest and back pain. Pain feels gnawing like heartburn. Felt lightheaded. Took Mylanta and Tums, which did not help much. FINDINGS: LUNGS AND PLEURA: No focal pulmonary opacity. No pulmonary edema. No pleural effusion. No pneumothorax. HEART AND MEDIASTINUM: No acute abnormality of the cardiac and mediastinal silhouettes. BONES AND SOFT TISSUES: No acute osseous abnormality. IMPRESSION: 1. No acute process. Electronically signed by: Rockey Kilts MD 06/06/2024 09:54 AM EDT RP Workstation: [id number]    Procedures Procedures (including critical care time)  Medications Ordered in UC Medications - No data to display  Initial Impression / Assessment and Plan / UC Course  I have reviewed the triage vital signs and the nursing notes.  Pertinent labs & imaging results that were available during my care of the patient were reviewed by me and considered in my medical decision making (see chart for details).     Discussed that the EKG is unchanged at that the chest x-ray is normal. Discussed that a normal or unchanged EKG does not completely exclude the presence of cardiac disease and that he needs additional cardiac workup if he continues to have chest pain Discussed that he has not been tested or  treated for H. pylori and this could be causing him to have unremitting heartburn pain With tenderness on his chest wall and believe his current pain is likely musculoskeletal PCP follow-up emphasized Final Clinical Impressions(s) / UC Diagnoses   Final diagnoses:  Chest pain, unspecified type  Chest wall pain  Gastroesophageal reflux disease, unspecified whether esophagitis present  NSTEMI (non-ST elevated myocardial infarction) [facility])     Discharge Instructions      I think the chest pain you are experiencing today is musculoskeletal from the golfing activity Your EKG and chest x-ray are normal You need to make an appointment to follow-up with your primary care doctor to assess whether you need additional heart or reflux testing/treatment     ED Prescriptions   None    PDMP not reviewed this encounter.   Maranda Jamee Jacob, MD 06/06/24 1026

## 2024-06-06 NOTE — Discharge Instructions (Addendum)
 I think the chest pain you are experiencing today is musculoskeletal from the golfing activity Your EKG and chest x-ray are normal You need to make an appointment to follow-up with your primary care doctor to assess whether you need additional heart or reflux testing/treatment

## 2024-06-06 NOTE — ED Triage Notes (Signed)
 Back and chest discomfort x couple of months. Went to hit golf balls yesterday which caused him to have chest and back pain. Pain feels gnawing like heartburn. Felt lightheaded. Took mylanta and tums, which did not help much. Took 2 baby aspirin last night. Went to ER in January for same, had heart checked out and everything was fine.

## 2024-06-17 ENCOUNTER — Ambulatory Visit: Admitting: Family Medicine

## 2024-06-18 ENCOUNTER — Other Ambulatory Visit: Payer: Self-pay | Admitting: Family Medicine

## 2024-07-03 NOTE — Progress Notes (Signed)
 Agree with documentation as above.   Nani Gasser, MD

## 2024-07-16 ENCOUNTER — Encounter: Payer: Self-pay | Admitting: Sports Medicine

## 2024-07-23 ENCOUNTER — Ambulatory Visit: Admitting: Family Medicine

## 2024-07-23 ENCOUNTER — Ambulatory Visit (INDEPENDENT_AMBULATORY_CARE_PROVIDER_SITE_OTHER)

## 2024-07-23 ENCOUNTER — Encounter: Payer: Self-pay | Admitting: Family Medicine

## 2024-07-23 VITALS — BP 134/82 | HR 66 | Ht 74.0 in | Wt 201.0 lb

## 2024-07-23 DIAGNOSIS — M4802 Spinal stenosis, cervical region: Secondary | ICD-10-CM | POA: Diagnosis not present

## 2024-07-23 DIAGNOSIS — I10 Essential (primary) hypertension: Secondary | ICD-10-CM | POA: Diagnosis not present

## 2024-07-23 DIAGNOSIS — M5030 Other cervical disc degeneration, unspecified cervical region: Secondary | ICD-10-CM

## 2024-07-23 DIAGNOSIS — M79602 Pain in left arm: Secondary | ICD-10-CM | POA: Diagnosis not present

## 2024-07-23 DIAGNOSIS — M47812 Spondylosis without myelopathy or radiculopathy, cervical region: Secondary | ICD-10-CM | POA: Diagnosis not present

## 2024-07-23 DIAGNOSIS — M25512 Pain in left shoulder: Secondary | ICD-10-CM | POA: Diagnosis not present

## 2024-07-23 DIAGNOSIS — R079 Chest pain, unspecified: Secondary | ICD-10-CM

## 2024-07-23 DIAGNOSIS — R7989 Other specified abnormal findings of blood chemistry: Secondary | ICD-10-CM | POA: Diagnosis not present

## 2024-07-23 DIAGNOSIS — R202 Paresthesia of skin: Secondary | ICD-10-CM

## 2024-07-23 DIAGNOSIS — E039 Hypothyroidism, unspecified: Secondary | ICD-10-CM | POA: Diagnosis not present

## 2024-07-23 DIAGNOSIS — H6993 Unspecified Eustachian tube disorder, bilateral: Secondary | ICD-10-CM

## 2024-07-23 DIAGNOSIS — G8929 Other chronic pain: Secondary | ICD-10-CM

## 2024-07-23 NOTE — Progress Notes (Signed)
 Pt reports that he has been seen at [facility] for Chest pain over 1 month ago. He feels like this is more of a nerve issue. He said that the day before he was seen he was out playing golf.

## 2024-07-23 NOTE — Progress Notes (Signed)
 Established Patient Office Visit  Subjective  Patient ID: Robert Morris, male    DOB: [birth year]  Age: 48 y.o. MRN: [id number]  Chief Complaint  Patient presents with   Shoulder Pain    HPI Discussed the use of AI scribe software for clinical note transcription with the patient, who gave verbal consent to proceed.  History of Present Illness Anthem Frazer is a 48 year old male who presents with persistent chest and shoulder pain.  Chest and shoulder pain - Intermittent episodes of chest and shoulder pain, described as a 'pulled muscle' sensation in the back and shoulder blade, radiating to the front of the chest - Pain severity can reach 10 out of 10 - Episodes last two to three days and occur sporadically, with months of relief between episodes - Most recent episode followed hitting [place] balls [place], initially thought to be heartburn - Pain was severe enough to prompt an urgent care visit; EKG was normal  Left upper extremity neuromuscular symptoms - History of left shoulder surgery for torn labrum in the late 1990s - Left shoulder and forearm feel tight - Occasional numbness and tingling in the left hand, with preserved sensation - Physical therapy for left shoulder earlier this year without significant improvement - History of right elbow surgery for torn extensor tendon, resulting in overuse of left arm and subsequent pain  Ear pressure and eustachian tube dysfunction symptoms - Pressure in ears, particularly when driving, similar to the sensation of needing to 'pop' ears as if in the mountains - Frequent ear clearing - Previous examinations showed no fluid in ears - Uses Nasacort  daily without significant improvement in symptoms  Headache and cervical symptoms - No recent neck pain - Back headaches and pressure in ears  Cardiovascular symptoms and blood pressure control - Currently taking atenolol , which is beneficial in calming him and  lowering pulse rate - Home blood pressure readings are good, though stress can cause temporary increases - No current swelling in legs      ROS    Objective:     BP 134/82   Pulse 66   Ht 6' 2 (1.88 m)   Wt 201 lb (91.2 kg)   SpO2 100%   BMI 25.81 kg/m    Physical Exam Vitals and nursing note reviewed.  Constitutional:      Appearance: Normal appearance.  HENT:     Head: Normocephalic and atraumatic.     Right Ear: Tympanic membrane, ear canal and external ear normal.     Left Ear: Tympanic membrane, ear canal and external ear normal.     Mouth/Throat:     Pharynx: Oropharynx is clear. No oropharyngeal exudate.  Eyes:     Conjunctiva/sclera: Conjunctivae normal.  Cardiovascular:     Rate and Rhythm: Normal rate and regular rhythm.     Comments: Radial pulse on left 2+ Pulmonary:     Effort: Pulmonary effort is normal.     Breath sounds: Normal breath sounds.  Musculoskeletal:     Comments: Normal cervical ROM and left shoulder ROM.  Neg empty can test. Dec external rotation on left compared to right  Skin:    General: Skin is warm and dry.  Neurological:     Mental Status: He is alert.  Psychiatric:        Mood and Affect: Mood normal.      No results found for any visits on 07/23/24.    The ASCVD Risk score (  Arnett DK, et al., 2019) failed to calculate for the following reasons:   Risk score cannot be calculated because patient has a medical history suggesting prior/existing ASCVD    Assessment & Plan:   Problem List Items Addressed This Visit       Cardiovascular and Mediastinum   Essential hypertension   Hypertension, well controlled on atenolol  Hypertension well controlled with atenolol , effective in managing blood pressure and heart rate.        Endocrine   Hypothyroid   Plant to recheck TSH with recent CP      Relevant Orders   TSH     Musculoskeletal and Integument   DDD (degenerative disc disease), cervical   Other Visit  Diagnoses       Intermittent chest pain    -  Primary   Relevant Orders   CT CARDIAC SCORING (SELF PAY ONLY)     Chronic left shoulder pain         Dysfunction of both eustachian tubes         Left hand paresthesia       Relevant Orders   DG Cervical Spine Complete     Elevated serum creatinine       Relevant Orders   CMP14+EGFR   TSH      Assessment and Plan Assessment & Plan Intermittent chest pain, non-cardiac suspected Intermittent chest pain likely musculoskeletal. Normal EKG. Discussed cardiac CT for plaque assessment due to recurrent symptoms. - Order cardiac CT to assess plaque burden in coronary arteries. - Advise to report any changes in pain nature, heart racing, or swelling.  Recurrent left shoulder pain with history of labral tear and surgery Recurrent shoulder pain post-labral tear surgery, exacerbated by activity, with limited range of motion.  Possible cervical radiculopathy with left arm and forearm symptoms Left arm and forearm symptoms suggest possible cervical radiculopathy, likely due to degenerative disc disease. - Order cervical spine imaging to evaluate for degenerative disc disease. - in review of records had xray of neck done 10/17, 8 yr ago, showing no major issues.   Eustachian tube dysfunction with ear pressure Ear pressure likely due to Eustachian tube dysfunction. Discussed trial of oral antihistamines. - Recommend trial of oral antihistamine such as Zyrtec, Claritin, or Allegra for 3-4 weeks. - Consider referral to ENT if symptoms persist despite treatment.      Return in about 6 months (around 01/20/2025) for Hypertension.    [name], MD

## 2024-07-23 NOTE — Assessment & Plan Note (Signed)
 Plant to recheck TSH with recent CP

## 2024-07-23 NOTE — Assessment & Plan Note (Signed)
 Hypertension, well controlled on atenolol  Hypertension well controlled with atenolol , effective in managing blood pressure and heart rate.

## 2024-07-24 ENCOUNTER — Encounter: Payer: Self-pay | Admitting: Family Medicine

## 2024-07-24 ENCOUNTER — Ambulatory Visit: Payer: Self-pay | Admitting: Family Medicine

## 2024-07-24 DIAGNOSIS — R202 Paresthesia of skin: Secondary | ICD-10-CM

## 2024-07-24 DIAGNOSIS — G8929 Other chronic pain: Secondary | ICD-10-CM

## 2024-07-24 DIAGNOSIS — K760 Fatty (change of) liver, not elsewhere classified: Secondary | ICD-10-CM

## 2024-07-24 DIAGNOSIS — E781 Pure hyperglyceridemia: Secondary | ICD-10-CM

## 2024-07-24 LAB — TSH: TSH: 4.37 u[IU]/mL (ref 0.450–4.500)

## 2024-07-24 LAB — CMP14+EGFR
ALT: 29 IU/L (ref 0–44)
AST: 41 IU/L — ABNORMAL HIGH (ref 0–40)
Albumin: 4.8 g/dL (ref 4.1–5.1)
Alkaline Phosphatase: 71 IU/L (ref 44–121)
BUN/Creatinine Ratio: 14 (ref 9–20)
BUN: 17 mg/dL (ref 6–24)
Bilirubin Total: 1 mg/dL (ref 0.0–1.2)
CO2: 22 mmol/L (ref 20–29)
Calcium: 9.7 mg/dL (ref 8.7–10.2)
Chloride: 100 mmol/L (ref 96–106)
Creatinine, Ser: 1.22 mg/dL (ref 0.76–1.27)
Globulin, Total: 2.4 g/dL (ref 1.5–4.5)
Glucose: 86 mg/dL (ref 70–99)
Potassium: 4.5 mmol/L (ref 3.5–5.2)
Sodium: 138 mmol/L (ref 134–144)
Total Protein: 7.2 g/dL (ref 6.0–8.5)
eGFR: 73 mL/min/1.73 (ref >59)

## 2024-07-24 NOTE — Progress Notes (Signed)
 Hi Brad, kidney function is stable it is actually a little bit better than it was 4 months ago which is good the AST which is a liver enzyme is mildly elevated.  I do want a keep an eye on that.  Make sure you are avoiding any excess Tylenol or alcohol products.  And we will plan to recheck liver function in 3 months.  Thyroid  looks okay but I really like to push her TSH just a little bit to the lower end of normal.  Can you take an extra half a tab 1 day a week.  So for example on Sundays she would take 1-1/2 tabs and the other 6 days of the week you would take a whole tab.  Then repeat this pattern weekly.  I will plan to recheck your TSH in 3 months as well.

## 2024-07-26 ENCOUNTER — Ambulatory Visit (INDEPENDENT_AMBULATORY_CARE_PROVIDER_SITE_OTHER): Payer: Self-pay

## 2024-07-26 DIAGNOSIS — R079 Chest pain, unspecified: Secondary | ICD-10-CM

## 2024-07-30 NOTE — Progress Notes (Signed)
 Hi Robert Morris, x-rays of your neck shows some mild narrowing on the left side at the area where the nerve root comes out, called the foramen, at C3-4.  And also some arthritis and changes at C3-4 with some degenerative disc as well.  I do think some of your shoulder and arm symptoms could be coming from this.  Next up would be to get you in with one of our sports med docs we have Dr. Sharlene at our [facility] location if that would be convenient for you.  Let me know if you would like us  to place a referral.

## 2024-07-31 NOTE — Telephone Encounter (Signed)
 Patient has been scheduled 08/01/24 at 9:10 am with Dr. Arvell.

## 2024-07-31 NOTE — Progress Notes (Signed)
 Hi Brad, your coronary calcium  score was 31 this actually puts you in the 80th percentile for age and race and sex.  Meaning that you are at increased risk of having a vascular event, primarily on heart attack.  My recommendation to better reduce your risk would be to increase your atorvastatin  to 40 mg for better risk reduction we know that moderate to high intensity statins are truly more effective for people who are at increased risk.  If you are okay with that I can send over a new an updated prescription and then just continue to work on healthy diet and regular exercise.

## 2024-08-01 ENCOUNTER — Ambulatory Visit: Admitting: Sports Medicine

## 2024-08-01 ENCOUNTER — Encounter: Payer: Self-pay | Admitting: Sports Medicine

## 2024-08-01 VITALS — BP 122/88 | Ht 74.0 in | Wt 201.0 lb

## 2024-08-01 DIAGNOSIS — R200 Anesthesia of skin: Secondary | ICD-10-CM

## 2024-08-01 NOTE — Progress Notes (Signed)
   Subjective:    Patient ID: Robert Morris, male    DOB: [birth year], 48 y.o.   MRN: [id number]  HPI chief complaint: Left arm numbness  Robert Morris is a very pleasant right-hand-dominant 48 year old male that presents today with approximately 1 year of left forearm numbness.  Numbness occurs when the arm is in a forward flexed position such as when resting his hand on the steering wheel when driving to the beach.  Numbness resolves when he puts the arm at his side.  He has a history of a prior arthroscopic left shoulder labral repair done in 1998.  Injury occurred while playing baseball.  Upon his return he had an immediate return of pain but elected not to have any more surgeries.  Since then, he has had shoulder discomfort intermittently throughout the years.  When he gets the numbness in his forearm he does get that familiar pain in the posterior shoulder.  Numbness does not radiate into the hand.  He has not noticed any weakness in the arm.  He does occasionally get some neck pain.  Recent x-rays of his cervical spine showed some mild narrowing at C3-C4 and some minimal degenerative disc disease at C4-C5.  Past medical history reviewed Medications reviewed Allergies reviewed   Review of Systems As above    Objective:   Physical Exam  Well-developed, well-nourished.  No acute distress  Cervical spine: Full painless cervical range of motion.  No tenderness to palpation.  Negative Spurling's.  Left shoulder: Full range of motion.  Rotator cuff strength is 5/5.  He does have pain with O'Brien's testing.  Neurological exam: Strength is 5/5 in both upper extremities.  No atrophy.  Reflexes are brisk and equal at the biceps, triceps, and brachial radialis tendons bilaterally.  X-ray of his cervical spine is as above.       Assessment & Plan:   Left arm numbness status post remote left shoulder labral repair  I do believe the nerve irritation he experiences with putting his arm in a  dependent position is originating from his left shoulder.  His exam does not suggest cervical disc pathology and his symptoms are fairly well-tolerated at this point.  I would recommend no further workup or treatment at this time.  If symptoms become more persistent or he begins to develop numbness in other parts of the body, weakness, or atrophy, then an EMG/nerve conduction study should be considered at that time.  Follow-up as needed.  This note was dictated using Dragon naturally speaking software and may contain errors in syntax, spelling, or content which have not been identified prior to signing this note.

## 2024-08-06 MED ORDER — ATORVASTATIN CALCIUM 40 MG PO TABS: 40.0000 mg | ORAL_TABLET | Freq: Every day | ORAL | 3 refills | Status: DC

## 2024-08-06 NOTE — Addendum Note (Signed)
 Addended by: Vashawn Ekstein D on: 08/06/2024 06:00 PM   Modules accepted: Orders

## 2024-08-08 ENCOUNTER — Encounter: Payer: Self-pay | Admitting: Family Medicine

## 2024-08-08 DIAGNOSIS — K760 Fatty (change of) liver, not elsewhere classified: Secondary | ICD-10-CM | POA: Insufficient documentation

## 2024-08-08 NOTE — Progress Notes (Signed)
 He just wanted let you know they did an over read on the surrounding tissues on the CT cardiac imaging.  No acute findings.  They did see some increased fat content in the liver called hepatic steatosis.  Treatment for that is to really reduce carbs and processed foods in the diet and trying to reduce visceral fat.

## 2024-09-10 ENCOUNTER — Encounter: Payer: Self-pay | Admitting: Family Medicine

## 2024-09-10 ENCOUNTER — Ambulatory Visit: Admitting: Family Medicine

## 2024-09-10 VITALS — BP 128/72 | HR 63 | Ht 71.0 in | Wt 198.1 lb

## 2024-09-10 DIAGNOSIS — Z0184 Encounter for antibody response examination: Secondary | ICD-10-CM

## 2024-09-10 DIAGNOSIS — R931 Abnormal findings on diagnostic imaging of heart and coronary circulation: Secondary | ICD-10-CM | POA: Insufficient documentation

## 2024-09-10 DIAGNOSIS — E039 Hypothyroidism, unspecified: Secondary | ICD-10-CM | POA: Diagnosis not present

## 2024-09-10 DIAGNOSIS — I10 Essential (primary) hypertension: Secondary | ICD-10-CM

## 2024-09-10 DIAGNOSIS — E781 Pure hyperglyceridemia: Secondary | ICD-10-CM

## 2024-09-10 DIAGNOSIS — K760 Fatty (change of) liver, not elsewhere classified: Secondary | ICD-10-CM

## 2024-09-10 NOTE — Progress Notes (Signed)
 Established Patient Office Visit  Patient ID: Robert Morris, male    DOB: 08/30/1976  Age: 48 y.o. MRN: [id number] PCP: Alvan Dorothyann BIRCH, MD  Chief Complaint  Patient presents with   Medical Management of Chronic Issues    Subjective:     HPI  Discussed the use of AI scribe software for clinical note transcription with the patient, who gave verbal consent to proceed.  History of Present Illness Robert Morris is a 48 year old male with coronary artery disease who presents for evaluation of his calcium  score and cardiovascular risk.  Coronary artery disease and cardiovascular risk - Coronary artery disease with recent coronary artery calcium  score of 31, corresponding to the 80th percentile for age and gender - Currently taking atorvastatin  20 mg daily for cholesterol management without significant side effects - No significant changes in exercise tolerance - No shortness of breath - Interested in optimizing cardiovascular risk factors, including possible medication adjustments  Headache and musculoskeletal symptoms - Occasional headaches, improved recently - Generalized aches and pains attributed to aging - No impact on exercise tolerance  Physical activity - Recently started playing pickleball once a week for approximately three hours per session - Perceives improvement in physical fitness - Feels generally well this week  Hepatic steatosis and liver disease risk - Hepatic steatosis identified on imaging, managed with dietary modifications - Family history of liver disease: mother and grandmother died from cirrhosis - Concerned about genetic risk for liver disease - Considering hepatitis B vaccination status due to family history; unsure of prior vaccination and considering checking antibody status     ROS    Objective:     BP 128/72   Pulse 63   Ht 5' 11 (1.803 m)   Wt 198 lb 1.9 oz (89.9 kg)   SpO2 100%   BMI 27.63 kg/m    Physical  Exam Vitals and nursing note reviewed.  Constitutional:      Appearance: Normal appearance.  HENT:     Head: Normocephalic and atraumatic.  Eyes:     Conjunctiva/sclera: Conjunctivae normal.  Cardiovascular:     Rate and Rhythm: Normal rate and regular rhythm.  Pulmonary:     Effort: Pulmonary effort is normal.     Breath sounds: Normal breath sounds.  Skin:    General: Skin is warm and dry.  Neurological:     Mental Status: He is alert and oriented to person, place, and time.  Psychiatric:        Mood and Affect: Mood normal.        Behavior: Behavior normal.      No results found for any visits on 09/10/24.    The ASCVD Risk score (Arnett DK, et al., 2019) failed to calculate for the following reasons:   Risk score cannot be calculated because patient has a medical history suggesting prior/existing ASCVD    Assessment & Plan:   Problem List Items Addressed This Visit       Cardiovascular and Mediastinum   Essential hypertension - Primary   BP at goal. continue current regimen      Relevant Orders   Hepatitis B Surface AntiBODY   Lipid panel   CMP14+EGFR   TSH   Elevated coronary artery calcium  score   Coronary artery calcification (Atherosclerotic heart disease of native coronary artery without angina pectoris) Coronary artery calcification score of 31, 80th percentile for age and sex, indicating above-average risk. No immediate need for cardiology referral or intervention.  Emphasized aggressive management of modifiable risk factors to prevent further plaque accumulation. Minimal plaque reduction possible with increased statin dosage. - Increase atorvastatin  from 20 mg to 40 mg daily for moderate intensity statin therapy. - Recheck blood work in 3 months to assess response to increased statin dosage. - Encourage adherence to a Mediterranean diet to reduce cardiovascular risk. - Advise regular physical activity, such as playing pickleball, to improve  cardiovascular health. - Repeat coronary artery calcium  scoring in 5 years unless symptoms develop.        Digestive   Fatty liver   Hepatic steatosis (fatty liver) Discussed dietary role in management and potential genetic predisposition due to family history of cirrhosis. Risk of progression to cirrhosis if lifestyle changes not implemented. - Advise dietary modifications to reduce processed foods and unhealthy fats. - Consider checking hepatitis B antibody levels during next blood work to assess vaccination status, given family history of liver disease.      Relevant Orders   Hepatitis B Surface AntiBODY   Lipid panel   CMP14+EGFR   TSH     Endocrine   Hypothyroid   Relevant Orders   Hepatitis B Surface AntiBODY   Lipid panel   CMP14+EGFR   TSH     Other   Hypertriglyceridemia   Hyperlipidemia Managed with atorvastatin  20 mg daily. Benefits of increased statin dosage discussed in context of above-average coronary artery calcification. Emphasized dietary modifications and regular exercise. - Increase atorvastatin  to 40 mg daily. - Recheck lipid panel in 3 months. - Encourage Mediterranean diet and regular exercise.      Other Visit Diagnoses       Immunity status testing       Relevant Orders   Hepatitis B Surface AntiBODY       Assessment and Plan Assessment & Plan  General Health Maintenance Upcoming need for pneumonia vaccine at age 13. Consideration of hepatitis B vaccination due to family history of liver disease and occupational exposure risk. Option of hepatitis A and B vaccination (Twinrix) for additional protection discussed. - Administer pneumonia vaccine at next visit after turning 48. - Check hepatitis B antibody levels during next blood work to determine vaccination status. - Consider hepatitis A and B vaccination (Twinrix) if not previously vaccinated, especially given occupational exposure risk.    Return in about 6 months (around 03/11/2025)  for Hypertension.    [name], MD [facility]

## 2024-09-10 NOTE — Assessment & Plan Note (Signed)
 Hyperlipidemia Managed with atorvastatin  20 mg daily. Benefits of increased statin dosage discussed in context of above-average coronary artery calcification. Emphasized dietary modifications and regular exercise. - Increase atorvastatin  to 40 mg daily. - Recheck lipid panel in 3 months. - Encourage Mediterranean diet and regular exercise.

## 2024-09-10 NOTE — Assessment & Plan Note (Signed)
 Coronary artery calcification (Atherosclerotic heart disease of native coronary artery without angina pectoris) Coronary artery calcification score of 31, 80th percentile for age and sex, indicating above-average risk. No immediate need for cardiology referral or intervention. Emphasized aggressive management of modifiable risk factors to prevent further plaque accumulation. Minimal plaque reduction possible with increased statin dosage. - Increase atorvastatin  from 20 mg to 40 mg daily for moderate intensity statin therapy. - Recheck blood work in 3 months to assess response to increased statin dosage. - Encourage adherence to a Mediterranean diet to reduce cardiovascular risk. - Advise regular physical activity, such as playing pickleball, to improve cardiovascular health. - Repeat coronary artery calcium  scoring in 5 years unless symptoms develop.

## 2024-09-10 NOTE — Assessment & Plan Note (Signed)
 Hepatic steatosis (fatty liver) Discussed dietary role in management and potential genetic predisposition due to family history of cirrhosis. Risk of progression to cirrhosis if lifestyle changes not implemented. - Advise dietary modifications to reduce processed foods and unhealthy fats. - Consider checking hepatitis B antibody levels during next blood work to assess vaccination status, given family history of liver disease.

## 2024-09-10 NOTE — Assessment & Plan Note (Signed)
 BP at goal continue current regimen.

## 2024-09-11 ENCOUNTER — Ambulatory Visit: Payer: Self-pay | Admitting: Family Medicine

## 2024-09-11 LAB — LIPID PANEL
Chol/HDL Ratio: 4.3 ratio (ref 0.0–5.0)
Cholesterol, Total: 166 mg/dL (ref 100–199)
HDL: 39 mg/dL — ABNORMAL LOW (ref >39)
LDL Chol Calc (NIH): 87 mg/dL (ref 0–99)
Triglycerides: 236 mg/dL — ABNORMAL HIGH (ref 0–149)
VLDL Cholesterol Cal: 40 mg/dL (ref 5–40)

## 2024-09-11 LAB — CMP14+EGFR
ALT: 23 IU/L (ref 0–44)
AST: 18 IU/L (ref 0–40)
Albumin: 4.5 g/dL (ref 4.1–5.1)
Alkaline Phosphatase: 70 IU/L (ref 47–123)
BUN/Creatinine Ratio: 13 (ref 9–20)
BUN: 14 mg/dL (ref 6–24)
Bilirubin Total: 0.7 mg/dL (ref 0.0–1.2)
CO2: 23 mmol/L (ref 20–29)
Calcium: 9.6 mg/dL (ref 8.7–10.2)
Chloride: 101 mmol/L (ref 96–106)
Creatinine, Ser: 1.1 mg/dL (ref 0.76–1.27)
Globulin, Total: 2.2 g/dL (ref 1.5–4.5)
Glucose: 88 mg/dL (ref 70–99)
Potassium: 4.3 mmol/L (ref 3.5–5.2)
Sodium: 139 mmol/L (ref 134–144)
Total Protein: 6.7 g/dL (ref 6.0–8.5)
eGFR: 82 mL/min/1.73 (ref >59)

## 2024-09-11 LAB — TSH: TSH: 1.75 u[IU]/mL (ref 0.450–4.500)

## 2024-09-11 LAB — HEPATITIS B SURFACE ANTIBODY,QUALITATIVE: Hep B Surface Ab, Qual: NONREACTIVE

## 2024-09-11 NOTE — Progress Notes (Signed)
 HI Brad, your triglycerides are still high but better!! Keep working at it.  We are not immune to hepatitis B so I would recommend you get vaccinated. Your liver function looks great.  Your thyroid  is normal.

## 2024-09-30 ENCOUNTER — Encounter: Payer: Self-pay | Admitting: Family Medicine

## 2024-09-30 MED ORDER — ROSUVASTATIN CALCIUM 20 MG PO TABS: 20.0000 mg | ORAL_TABLET | Freq: Every day | ORAL | 1 refills | Status: AC

## 2024-10-16 ENCOUNTER — Other Ambulatory Visit: Payer: Self-pay | Admitting: Family Medicine

## 2024-10-16 ENCOUNTER — Other Ambulatory Visit: Payer: Self-pay | Admitting: *Deleted

## 2024-10-16 DIAGNOSIS — E039 Hypothyroidism, unspecified: Secondary | ICD-10-CM

## 2024-10-16 MED ORDER — LEVOTHYROXINE SODIUM 88 MCG PO TABS: 88.0000 ug | ORAL_TABLET | Freq: Every day | ORAL | 0 refills | Status: AC

## 2025-03-11 ENCOUNTER — Ambulatory Visit: Admitting: Family Medicine
